# Patient Record
Sex: Male | Born: 1965
Health system: Southern US, Community
[De-identification: ages and names within clinical notes are randomized; demographics above are authoritative.]

## PROBLEM LIST (undated history)

## (undated) DIAGNOSIS — Z9289 Personal history of other medical treatment: Secondary | ICD-10-CM

## (undated) DIAGNOSIS — Z972 Presence of dental prosthetic device (complete) (partial): Secondary | ICD-10-CM

## (undated) DIAGNOSIS — E785 Hyperlipidemia, unspecified: Secondary | ICD-10-CM

## (undated) DIAGNOSIS — J42 Unspecified chronic bronchitis: Secondary | ICD-10-CM

## (undated) DIAGNOSIS — R945 Abnormal results of liver function studies: Secondary | ICD-10-CM

## (undated) DIAGNOSIS — R7301 Impaired fasting glucose: Secondary | ICD-10-CM

## (undated) DIAGNOSIS — Z87891 Personal history of nicotine dependence: Secondary | ICD-10-CM

## (undated) DIAGNOSIS — I10 Essential (primary) hypertension: Secondary | ICD-10-CM

## (undated) HISTORY — DX: Impaired fasting glucose: R73.01

## (undated) HISTORY — DX: Unspecified chronic bronchitis: J42

## (undated) HISTORY — DX: Hyperlipidemia, unspecified: E78.5

## (undated) HISTORY — DX: Personal history of nicotine dependence: Z87.891

## (undated) HISTORY — DX: Abnormal results of liver function studies: R94.5

## (undated) HISTORY — DX: Essential (primary) hypertension: I10

## (undated) HISTORY — DX: Presence of dental prosthetic device (complete) (partial): Z97.2

## (undated) HISTORY — DX: Personal history of other medical treatment: Z92.89

## (undated) HISTORY — PX: WISDOM TOOTH EXTRACTION: SHX21

---

## 2003-03-19 ENCOUNTER — Emergency Department (HOSPITAL_COMMUNITY): Admission: EM | Admit: 2003-03-19 | Discharge: 2003-03-19 | Payer: Self-pay | Admitting: *Deleted

## 2009-02-15 ENCOUNTER — Ambulatory Visit: Payer: Self-pay | Admitting: Family Medicine

## 2009-02-15 ENCOUNTER — Encounter: Payer: Self-pay | Admitting: Cardiology

## 2009-03-08 ENCOUNTER — Encounter: Payer: Self-pay | Admitting: Cardiology

## 2009-03-08 DIAGNOSIS — Z9289 Personal history of other medical treatment: Secondary | ICD-10-CM

## 2009-03-08 HISTORY — DX: Personal history of other medical treatment: Z92.89

## 2011-06-12 ENCOUNTER — Encounter: Payer: Self-pay | Admitting: Family Medicine

## 2011-12-04 ENCOUNTER — Encounter: Payer: Self-pay | Admitting: *Deleted

## 2014-05-05 ENCOUNTER — Encounter: Payer: Self-pay | Admitting: Family Medicine

## 2015-06-21 ENCOUNTER — Telehealth: Payer: Self-pay | Admitting: Family Medicine

## 2015-06-21 NOTE — Telephone Encounter (Signed)
Pt and Thaddeus Evitts called to schedule a CPE appt. Advised them that we are able to make an appt for the pt in our office because pt did not show or call to cancel the last CPE. PT says he does not remember that appt. He says he went to the cardiologist around that time in 2015 however I advised him that I saw that he went to cardiologist in 2013. He wants to know if we will remove this appt exception on his account since he does not remember the previous CPE.

## 2015-06-26 ENCOUNTER — Telehealth: Payer: Self-pay | Admitting: Family Medicine

## 2015-06-26 NOTE — Telephone Encounter (Signed)
Wife called and wants [place] to take her husband as a new pt.  He has no insurance.  I advised we are not taking patients with out insurance.  She will call me back when he gets insurance.  He has a no show from 2015, we will waive, since he used to be a patient of ours several years ago and wife is still a patient of ours.  I explained to her normally we will not see a 1st time pt if they no show.  She thinks he was in the hospital or something at that time.

## 2015-06-28 NOTE — Telephone Encounter (Signed)
He would be considered a new patient.  Right now he has no insurance.  He will contact us once he gets his insurance.

## 2016-04-02 HISTORY — PX: NO PAST SURGERIES: SHX2092

## 2016-04-10 ENCOUNTER — Encounter: Payer: Self-pay | Admitting: Medical

## 2016-04-10 ENCOUNTER — Encounter: Payer: Self-pay | Admitting: Gastroenterology

## 2016-04-10 ENCOUNTER — Ambulatory Visit (INDEPENDENT_AMBULATORY_CARE_PROVIDER_SITE_OTHER): Payer: Commercial Managed Care - PPO | Admitting: Medical

## 2016-04-10 VITALS — BP 140/80 | HR 78 | Resp 16 | Ht 73.0 in | Wt 197.4 lb

## 2016-04-10 DIAGNOSIS — Z720 Tobacco use: Secondary | ICD-10-CM

## 2016-04-10 DIAGNOSIS — Z23 Encounter for immunization: Secondary | ICD-10-CM | POA: Diagnosis not present

## 2016-04-10 DIAGNOSIS — Z7189 Other specified counseling: Secondary | ICD-10-CM

## 2016-04-10 DIAGNOSIS — Z125 Encounter for screening for malignant neoplasm of prostate: Secondary | ICD-10-CM | POA: Diagnosis not present

## 2016-04-10 DIAGNOSIS — Z113 Encounter for screening for infections with a predominantly sexual mode of transmission: Secondary | ICD-10-CM | POA: Diagnosis not present

## 2016-04-10 DIAGNOSIS — R9431 Abnormal electrocardiogram [ECG] [EKG]: Secondary | ICD-10-CM | POA: Diagnosis not present

## 2016-04-10 DIAGNOSIS — N529 Male erectile dysfunction, unspecified: Secondary | ICD-10-CM | POA: Diagnosis not present

## 2016-04-10 DIAGNOSIS — Z8249 Family history of ischemic heart disease and other diseases of the circulatory system: Secondary | ICD-10-CM

## 2016-04-10 DIAGNOSIS — Z7185 Encounter for immunization safety counseling: Secondary | ICD-10-CM

## 2016-04-10 DIAGNOSIS — R030 Elevated blood-pressure reading, without diagnosis of hypertension: Secondary | ICD-10-CM | POA: Diagnosis not present

## 2016-04-10 DIAGNOSIS — Z0000 Encounter for general adult medical examination without abnormal findings: Secondary | ICD-10-CM | POA: Diagnosis not present

## 2016-04-10 DIAGNOSIS — F17200 Nicotine dependence, unspecified, uncomplicated: Secondary | ICD-10-CM | POA: Insufficient documentation

## 2016-04-10 DIAGNOSIS — Z1211 Encounter for screening for malignant neoplasm of colon: Secondary | ICD-10-CM | POA: Diagnosis not present

## 2016-04-10 LAB — PSA: PSA: 0.4 ng/mL (ref <=4.0)

## 2016-04-10 LAB — POCT URINALYSIS DIPSTICK
Bilirubin, UA: NEGATIVE
GLUCOSE UA: NEGATIVE
KETONES UA: NEGATIVE
Leukocytes, UA: NEGATIVE
Nitrite, UA: NEGATIVE
Protein, UA: NEGATIVE
RBC UA: NEGATIVE
Urobilinogen, UA: NEGATIVE
pH, UA: 6

## 2016-04-10 LAB — CBC
HEMATOCRIT: 44.6 % (ref 38.5–50.0)
HEMOGLOBIN: 14.1 g/dL (ref 13.2–17.1)
MCH: 27.9 pg (ref 27.0–33.0)
MCHC: 31.6 g/dL — ABNORMAL LOW (ref 32.0–36.0)
MCV: 88.1 fL (ref 80.0–100.0)
MPV: 11.1 fL (ref 7.5–12.5)
Platelets: 264 10*3/uL (ref 140–400)
RBC: 5.06 MIL/uL (ref 4.20–5.80)
RDW: 14.2 % (ref 11.0–15.0)
WBC: 3.8 10*3/uL — AB (ref 4.0–10.5)

## 2016-04-10 MED ORDER — ASPIRIN EC 81 MG PO TBEC: 81.0000 mg | DELAYED_RELEASE_TABLET | Freq: Every day | ORAL | 3 refills | Status: DC

## 2016-04-10 MED ORDER — LOSARTAN POTASSIUM 25 MG PO TABS: 25.0000 mg | ORAL_TABLET | Freq: Every day | ORAL | 2 refills | Status: DC

## 2016-04-10 MED ORDER — VARENICLINE TARTRATE 0.5 MG X 11 & 1 MG X 42 PO MISC: ORAL | 0 refills | Status: DC

## 2016-04-10 NOTE — Progress Notes (Addendum)
Subjective:   HPI  Calvin Reid is a 50 y.o. male who presents for a complete physical.  New /returning patient.  Has been here several years ago.  No recent medical care.  No particular concerns.   At end of visit he does note problems getting and keeping erections, wants something to help.  Reviewed their medical, surgical, family, social, medication, and allergy history and updated chart as appropriate.  Past Medical History:  Diagnosis Date  . H/O cardiovascular stress test 03/08/2009   stress echo normal.   Dr. Peter Martinique  . Smoker   . Wears partial dentures     Past Surgical History:  Procedure Laterality Date  . NO PAST SURGERIES  04/2016    Social History   Social History  . Marital status: Single    Spouse name: N/A  . Number of children: N/A  . Years of education: N/A   Occupational History  . Not on file.   Social History Main Topics  . Smoking status: Current Every Day Smoker    Packs/day: 0.50    Years: 25.00    Types: Cigarettes  . Smokeless tobacco: Never Used  . Alcohol use 2.4 oz/week    4 Cans of beer per week  . Drug use: No  . Sexual activity: Not on file   Other Topics Concern  . 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   3 sons.   Divorced.   As of 04/2016.    Family History  Problem Relation Age of Onset  . Hypertension Mother   . COPD Father     died in his 103s  . Heart disease Sister 61    CAD  . Heart disease Son   . Heart disease Son   . Diabetes Brother   . Stroke Neg Hx   . Cancer Neg Hx      Current Outpatient Prescriptions:  .  Omega-3 Fatty Acids (FISH OIL PEARLS PO), Take by mouth., Disp: , Rfl:  .  aspirin EC 81 MG tablet, Take 1 tablet (81 mg total) by mouth daily., Disp: 90 tablet, Rfl: 3 .  losartan (COZAAR) 25 MG tablet, Take 1 tablet (25 mg total) by mouth daily., Disp: 30 tablet, Rfl: 2 .  varenicline (CHANTIX STARTING MONTH PAK) 0.5 MG X 11 & 1 MG X 42 tablet, Take  one 0.5 mg tablet by mouth once daily for 3 days, then increase to one 0.5 mg tablet twice daily for 4 days, then increase to one 1 mg tablet twice daily., Disp: 53 tablet, Rfl: 0  No Known Allergies    Review of Systems Constitutional: -fever, -chills, -sweats, -unexpected weight change, -decreased appetite, -fatigue Allergy: -sneezing, -itching, -congestion Dermatology: -changing moles, --rash, -lumps ENT: -runny nose, -ear pain, -sore throat, -hoarseness, -sinus pain, -teeth pain, - ringing in ears, -hearing loss, -nosebleeds Cardiology: -chest pain, -palpitations, -swelling, -difficulty breathing when lying flat, -waking up short of breath Respiratory: -cough, -shortness of breath, -difficulty breathing with exercise or exertion, -wheezing, -coughing up blood Gastroenterology: -abdominal pain, -nausea, -vomiting, -diarrhea, -constipation, -blood in stool, -changes in bowel movement, -difficulty swallowing or eating Hematology: -bleeding, -bruising  Musculoskeletal: -joint aches, -muscle aches, -joint swelling, -back pain, -neck pain, -cramping, -changes in gait Ophthalmology: denies vision changes, eye redness, itching, discharge Urology:+nocturia,  -burning with urination, -difficulty urinating, -blood in urine, -urinary frequency, -urgency, -incontinence Neurology: -headache, -weakness, -tingling, -numbness, -memory loss, -falls, -dizziness Psychology: -depressed  mood, -agitation, -sleep problems     Objective:   Physical Exam  BP 140/80   Pulse 78   Resp 16   Ht 6\' 1"  (1.854 m)   Wt 197 lb 6.4 oz (89.5 kg)   SpO2 98%   BMI 26.04 kg/m   General appearance: alert, no distress, WD/WN, AA male Skin: right preauricular area with brown raised 41mm round lesion unchanged for years per patient.   Few scattered macules.  No worrisome lesions HEENT: normocephalic, conjunctiva/corneas normal, sclerae anicteric, PERRLA, EOMi, nares patent, no discharge or erythema, pharynx normal Oral  cavity: MMM, tongue normal, teeth - upper denture Neck: supple, no lymphadenopathy, no thyromegaly, no masses, normal ROM, no bruits Chest: non tender, normal shape and expansion Heart: RRR, normal S1, S2, no murmurs Lungs: CTA bilaterally, no wheezes, rhonchi, or rales Abdomen: +bs, soft, non tender, non distended, no masses, no hepatomegaly, no splenomegaly, no bruits Back: non tender, normal ROM, no scoliosis Musculoskeletal: upper extremities non tender, no obvious deformity, normal ROM throughout, lower extremities non tender, no obvious deformity, normal ROM throughout Extremities: no edema, no cyanosis, no clubbing Pulses: 2+ symmetric, upper and lower extremities, normal cap refill Neurological: alert, oriented x 3, CN2-12 intact, strength normal upper extremities and lower extremities, sensation normal throughout, DTRs 2+ throughout, no cerebellar signs, gait normal Psychiatric: normal affect, behavior normal, pleasant  GU: normal male external genitalia, circumcised, nontender, no masses, no hernia, no lymphadenopathy Rectal: anus normal tone, prostate WNL, no nodules   Adult ECG Report  Indication: physical, smoker, family history  Rate: 59 bpm  Rhythm: sinus bradycardia  QRS Axis: 83 degrees  PR Interval: 239ms  QRS Duration: 52ms  QTc: 444ms  Conduction Disturbances: first-degree A-V block   Other Abnormalities: T wave inversions thorughout!  Patient's cardiac risk factors are: family history of premature cardiovascular disease, male gender and smoking/ tobacco exposure.  EKG comparison: 2010  Narrative Interpretation: new T wave inversions, 1st degree block, LVH!    Assessment and Plan :    Encounter Diagnoses  Name Primary?  . Annual physical exam Yes  . Smoker   . Special screening for malignant neoplasms, colon   . Family history of premature CAD   . Vaccine counseling   . Need for prophylactic vaccination against Streptococcus pneumoniae (pneumococcus)   .  Screening for prostate cancer   . Screen for STD (sexually transmitted disease)   . Nonspecific abnormal electrocardiogram (ECG) (EKG)   . Elevated blood-pressure reading without diagnosis of hypertension   . Erectile dysfunction, unspecified erectile dysfunction type     Physical exam - discussed healthy lifestyle, diet, exercise, preventative care, vaccinations, and addressed their concerns.   See your eye doctor yearly for routine vision care. See your dentist yearly for routine dental care including hygiene visits twice yearly. Refer for 1st colonoscopy PSA screen today, discussed risks/benefits Advised yearly flu shot Counseled on the pneumococcal vaccine.  Vaccine information sheet given.  Pneumococcal vaccine PPSV 23 given after consent obtained. Smoker - he is agreeable to try quitting. Advised counseling, begin trial of Chantix.   discussed risks/benefits of medication.  F/u 24mo. Elevated BP - advised smoking cessation, exercise, salt avoidance, healthy diet. referral to cardiology for abnormal EKG with concerning findings.  Given abnormal EKG, elevated BP, smoker, and risk factors, begin Losartan and Aspirin 81mg  QHS.  ED- will defer until after cardiology f/u Follow-up pending labs  Kanyon was seen today for annual exam.  Diagnoses and all orders for this visit:  Annual physical exam -     Urinalysis Dipstick -     Comprehensive metabolic panel -     CBC -     Lipid panel -     PSA -     Hemoglobin A1c -     HIV antibody -     RPR -     GC/Chlamydia Probe Amp -     EKG 12-Lead -     Ambulatory referral to Gastroenterology  Smoker -     EKG 12-Lead -     Ambulatory referral to Cardiology  Special screening for malignant neoplasms, colon -     Ambulatory referral to Gastroenterology  Family history of premature CAD -     EKG 12-Lead -     Ambulatory referral to Cardiology  Vaccine counseling  Need for prophylactic vaccination against Streptococcus  pneumoniae (pneumococcus)  Screening for prostate cancer -     PSA  Screen for STD (sexually transmitted disease) -     HIV antibody -     RPR -     GC/Chlamydia Probe Amp  Nonspecific abnormal electrocardiogram (ECG) (EKG) -     Ambulatory referral to Cardiology  Elevated blood-pressure reading without diagnosis of hypertension -     Ambulatory referral to Cardiology  Erectile dysfunction, unspecified erectile dysfunction type  Other orders -     aspirin EC 81 MG tablet; Take 1 tablet (81 mg total) by mouth daily. -     losartan (COZAAR) 25 MG tablet; Take 1 tablet (25 mg total) by mouth daily. -     varenicline (CHANTIX STARTING MONTH PAK) 0.5 MG X 11 & 1 MG X 42 tablet; Take one 0.5 mg tablet by mouth once daily for 3 days, then increase to one 0.5 mg tablet twice daily for 4 days, then increase to one 1 mg tablet twice daily.

## 2016-04-10 NOTE — Patient Instructions (Signed)
Encounter Diagnoses  Name Primary?  . Annual physical exam Yes  . Smoker   . Special screening for malignant neoplasms, colon   . Family history of premature CAD   . Vaccine counseling   . Need for prophylactic vaccination against Streptococcus pneumoniae (pneumococcus)   . Screening for prostate cancer   . Screen for STD (sexually transmitted disease)   . Nonspecific abnormal electrocardiogram (ECG) (EKG)   . Elevated blood-pressure reading without diagnosis of hypertension    Recommendations:  Your EKG is abnormal.  I want to refer you back to cardiology for consult  Begin Aspirin 81mg  nightly for heart health  Begin Losartan 25mg  daily for possible enlarged heart and elevated blood pressure given the EKG findings  After a week or so tolerating these medications, then begin Chantix to help stop tobacco  Pick a quit date to stop tobacco  See your eye doctor yearly for routine vision care.  See your dentist yearly for routine dental care including hygiene visits twice yearly.  Get a flu shot yearly.  We should have them in within the month  We are referring you for your first colonoscopy  We updated your pneumonia vaccine today

## 2016-04-10 NOTE — Addendum Note (Signed)
Addended by: Arley Phenix L on: 04/10/2016 10:14 AM   Modules accepted: Orders

## 2016-04-11 LAB — LIPID PANEL
CHOL/HDL RATIO: 3.8 ratio (ref <=5.0)
Cholesterol: 198 mg/dL (ref 125–200)
HDL: 52 mg/dL (ref >=40)
LDL CALC: 132 mg/dL — AB (ref <130)
TRIGLYCERIDES: 70 mg/dL (ref <150)
VLDL: 14 mg/dL (ref <30)

## 2016-04-11 LAB — COMPREHENSIVE METABOLIC PANEL
ALBUMIN: 5 g/dL (ref 3.6–5.1)
ALT: 47 U/L — ABNORMAL HIGH (ref 9–46)
AST: 37 U/L — ABNORMAL HIGH (ref 10–35)
Alkaline Phosphatase: 90 U/L (ref 40–115)
BUN: 19 mg/dL (ref 7–25)
CALCIUM: 10.6 mg/dL — AB (ref 8.6–10.3)
CHLORIDE: 104 mmol/L (ref 98–110)
CO2: 25 mmol/L (ref 20–31)
Creat: 1.17 mg/dL (ref 0.70–1.33)
Glucose, Bld: 93 mg/dL (ref 65–99)
POTASSIUM: 4.7 mmol/L (ref 3.5–5.3)
Sodium: 141 mmol/L (ref 135–146)
TOTAL PROTEIN: 7.8 g/dL (ref 6.1–8.1)
Total Bilirubin: 0.8 mg/dL (ref 0.2–1.2)

## 2016-04-11 LAB — HIV ANTIBODY (ROUTINE TESTING W REFLEX): HIV: NONREACTIVE

## 2016-04-11 LAB — GC/CHLAMYDIA PROBE AMP
CT Probe RNA: NOT DETECTED
GC Probe RNA: NOT DETECTED

## 2016-04-11 LAB — HEMOGLOBIN A1C
Hgb A1c MFr Bld: 5.8 % — ABNORMAL HIGH (ref <5.7)
Mean Plasma Glucose: 120 mg/dL

## 2016-04-11 LAB — RPR

## 2016-04-23 ENCOUNTER — Encounter: Payer: Self-pay | Admitting: Cardiology

## 2016-04-23 NOTE — Progress Notes (Signed)
Cardiology Office Note    Date:  04/24/2016   ID:  Calvin Reid, DOB 1966/02/09, MRN [id number]  PCP:  Crisoforo Oxford, PA-C  Cardiologist:  Daniela Siebers [place], MD    History of Present Illness:  Calvin Reid is a 50 y.o. male is seen at the request of Dorothea Ogle PA-C for evaluation of abnormal Ecg. He has a history of tobacco use. He is now on losartan for elevated BP. He was seen in 2010 for abnormal Ecg as well and had a normal Stress Echo in July 2010. Notes stated he had T wave inversions at the time that normalized with exercise. When recently seen for a physical Ecg was abnormal showing LVH with fairly striking T wave inversions. According to records these findings were more pronounced than prior Ecgs but I do not have a copy of prior tracings. He denies any chest pain or SOB. He works as a Patent attorney. No dizziness or syncope. Feels well overall. No family history of premature death or significant cardiac disease.    Past Medical History:  Diagnosis Date  . H/O cardiovascular stress test 03/08/2009   stress echo normal.   Dr. Javarian Jakubiak Martinique  . Smoker   . Wears partial dentures     Past Surgical History:  Procedure Laterality Date  . NO PAST SURGERIES  04/2016    Current Medications: Outpatient Medications Prior to Visit  Medication Sig Dispense Refill  . aspirin EC 81 MG tablet Take 1 tablet (81 mg total) by mouth daily. 90 tablet 3  . losartan (COZAAR) 25 MG tablet Take 1 tablet (25 mg total) by mouth daily. 30 tablet 2  . Omega-3 Fatty Acids (FISH OIL PEARLS PO) Take 1 capsule by mouth daily.     . varenicline (CHANTIX STARTING MONTH PAK) 0.5 MG X 11 & 1 MG X 42 tablet Take one 0.5 mg tablet by mouth once daily for 3 days, then increase to one 0.5 mg tablet twice daily for 4 days, then increase to one 1 mg tablet twice daily. 53 tablet 0   No facility-administered medications prior to visit.      Allergies:   Review of patient's allergies indicates no  known allergies.   Social History   Social History  . Marital status: Single    Spouse name: N/A  . Number of children: 2  . Years of education: N/A   Occupational History  . fork Theme park manager    Social History Main Topics  . Smoking status: Former Smoker    Packs/day: 0.50    Years: 25.00    Types: Cigarettes    Quit date: 04/21/2016  . Smokeless tobacco: Never Used  . Alcohol use 2.4 oz/week    4 Cans of beer per week  . Drug use: No  . Sexual activity: Not Asked   Other Topics Concern  . 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   3 sons.   Divorced.   As of 04/2016.     Family History:  The patient's family history includes COPD in his father; Diabetes in his brother; Heart disease in his son and son; Heart disease (age of onset: 69) in his sister; Hypertension in his mother. sister's heart disease is unknown. Son has arrhythmia related to Adderall.  ROS:   Please see the history of present illness.    ROS All other systems reviewed and are negative.   PHYSICAL EXAM:  VS:  BP 112/90 (BP Location: Left Arm) Comment: 116/90 Right Arm  Pulse (!) 53   Ht 6\' 1"  (1.854 m)   Wt 201 lb 6.4 oz (91.4 kg)   BMI 26.57 kg/m    GEN: Well nourished, well developed, in no acute distress  HEENT: normal  Neck: no JVD, carotid bruits, or masses Cardiac: RRR; no murmurs, rubs, or gallops,no edema  Respiratory:  clear to auscultation bilaterally, normal work of breathing GI: soft, nontender, nondistended, + BS MS: no deformity or atrophy  Skin: warm and dry, no rash Neuro:  Alert and Oriented x 3, Strength and sensation are intact Psych: euthymic mood, full affect  Wt Readings from Last 3 Encounters:  04/24/16 201 lb 6.4 oz (91.4 kg)  04/10/16 197 lb 6.4 oz (89.5 kg)  02/15/09 196 lb (88.9 kg)      Studies/Labs Reviewed:   EKG:  EKG is ordered today.  The ekg ordered today demonstrates NSR, LVH with marked repolarization abnormality. I  have personally reviewed and interpreted this study.   Recent Labs: 04/10/2016: ALT 47; BUN 19; Creat 1.17; Hemoglobin 14.1; Platelets 264; Potassium 4.7; Sodium 141   Lipid Panel    Component Value Date/Time   CHOL 198 04/10/2016 0849   TRIG 70 04/10/2016 0849   HDL 52 04/10/2016 0849   CHOLHDL 3.8 04/10/2016 0849   VLDL 14 04/10/2016 0849   LDLCALC 132 (H) 04/10/2016 0849    Additional studies/ records that were reviewed today include:  none    ASSESSMENT:    1. Abnormal EKG   2. Smoker   3. Elevated blood-pressure reading without diagnosis of hypertension   4. Abnormal ECG      PLAN:  In order of problems listed above:  1. Ecg is most consistent with LVH and repolarization abnormality. Will request old records to review prior tracings. Will update Echo. If Echo is OK no further evaluation is needed. This may be a normal variant repolarization abnormality in a young black male. Given lack of symptoms I do not think ischemic evaluation is needed unless EF is low. 2.   Tobacco abuse. He is trying to quit with Chantix. Encouraged efforts. 3.   Elevated BP. Improved on losartan.   He asked if he is ok to take Viagra and I see no contraindication.     Medication Adjustments/Labs and Tests Ordered: Current medicines are reviewed at length with the patient today.  Concerns regarding medicines are outlined above.  Medication changes, Labs and Tests ordered today are listed in the Patient Instructions below. Patient Instructions  We will schedule you for an echocardiogram       Signed, Tomeca Helm Martinique, MD  04/24/2016 9:14 AM    [place] [address] [place], [place], [address] [phone]

## 2016-04-24 ENCOUNTER — Encounter: Payer: Self-pay | Admitting: Cardiology

## 2016-04-24 ENCOUNTER — Ambulatory Visit (INDEPENDENT_AMBULATORY_CARE_PROVIDER_SITE_OTHER): Payer: Commercial Managed Care - PPO | Admitting: Cardiology

## 2016-04-24 VITALS — BP 112/90 | HR 53 | Ht 73.0 in | Wt 201.4 lb

## 2016-04-24 DIAGNOSIS — R030 Elevated blood-pressure reading, without diagnosis of hypertension: Secondary | ICD-10-CM | POA: Diagnosis not present

## 2016-04-24 DIAGNOSIS — R9431 Abnormal electrocardiogram [ECG] [EKG]: Secondary | ICD-10-CM

## 2016-04-24 DIAGNOSIS — Z720 Tobacco use: Secondary | ICD-10-CM | POA: Diagnosis not present

## 2016-04-24 DIAGNOSIS — F17200 Nicotine dependence, unspecified, uncomplicated: Secondary | ICD-10-CM

## 2016-04-24 NOTE — Patient Instructions (Signed)
We will schedule you for an echocardiogram.

## 2016-05-04 ENCOUNTER — Other Ambulatory Visit: Payer: Self-pay | Admitting: Medical

## 2016-05-08 ENCOUNTER — Other Ambulatory Visit: Payer: Self-pay

## 2016-05-08 ENCOUNTER — Ambulatory Visit (HOSPITAL_COMMUNITY): Payer: Commercial Managed Care - PPO | Attending: Internal Medicine

## 2016-05-08 DIAGNOSIS — R9431 Abnormal electrocardiogram [ECG] [EKG]: Secondary | ICD-10-CM | POA: Diagnosis present

## 2016-05-08 DIAGNOSIS — I5189 Other ill-defined heart diseases: Secondary | ICD-10-CM | POA: Insufficient documentation

## 2016-05-08 DIAGNOSIS — R030 Elevated blood-pressure reading, without diagnosis of hypertension: Secondary | ICD-10-CM | POA: Diagnosis not present

## 2016-05-08 DIAGNOSIS — I517 Cardiomegaly: Secondary | ICD-10-CM | POA: Insufficient documentation

## 2016-05-08 DIAGNOSIS — F17200 Nicotine dependence, unspecified, uncomplicated: Secondary | ICD-10-CM

## 2016-05-08 DIAGNOSIS — Z720 Tobacco use: Secondary | ICD-10-CM | POA: Insufficient documentation

## 2016-05-08 DIAGNOSIS — I340 Nonrheumatic mitral (valve) insufficiency: Secondary | ICD-10-CM | POA: Diagnosis not present

## 2016-05-08 DIAGNOSIS — Z8249 Family history of ischemic heart disease and other diseases of the circulatory system: Secondary | ICD-10-CM | POA: Insufficient documentation

## 2016-05-08 LAB — ECHOCARDIOGRAM COMPLETE
Ao-asc: 35 cm
E decel time: 280 msec
E/e' ratio: 6.61
FS: 50 % — AB (ref 28–44)
IVS/LV PW RATIO, ED: 1.51
LA ID, A-P, ES: 27 mm
LA diam index: 1.25 cm/m2
LA vol A4C: 37 ml
LA vol index: 14.8 mL/m2
LAVOL: 32 mL
LDCA: 3.14 cm2
LEFT ATRIUM END SYS DIAM: 27 mm
LV E/e' medial: 6.61
LV PW d: 8.15 mm — AB (ref 0.6–1.1)
LV TDI E'LATERAL: 11.8
LV TDI E'MEDIAL: 8.11
LV e' LATERAL: 11.8 cm/s
LVEEAVG: 6.61
LVOT VTI: 29.2 cm
LVOT peak grad rest: 6 mmHg
LVOT peak vel: 125 cm/s
LVOTD: 20 mm
LVOTSV: 92 mL
MV Dec: 280
MV pk A vel: 73.5 m/s
MV pk E vel: 78 m/s
MVPG: 2 mmHg
RV LATERAL S' VELOCITY: 12 cm/s
S' Lateral: 12 cm/s

## 2016-05-27 ENCOUNTER — Ambulatory Visit (AMBULATORY_SURGERY_CENTER): Payer: Self-pay

## 2016-05-27 VITALS — Ht 73.0 in | Wt 204.6 lb

## 2016-05-27 DIAGNOSIS — Z1211 Encounter for screening for malignant neoplasm of colon: Secondary | ICD-10-CM

## 2016-05-27 MED ORDER — SUPREP BOWEL PREP KIT 17.5-3.13-1.6 GM/177ML PO SOLN: 1.0000 | Freq: Once | ORAL | 0 refills | Status: AC

## 2016-05-27 NOTE — Progress Notes (Signed)
No allergies to eggs or soy No past problems with anesthesia No diet meds No home oxygen  Has email and internet registered for emmi 

## 2016-05-30 ENCOUNTER — Encounter: Payer: Self-pay | Admitting: Gastroenterology

## 2016-06-11 ENCOUNTER — Ambulatory Visit (AMBULATORY_SURGERY_CENTER): Payer: Commercial Managed Care - PPO | Admitting: Gastroenterology

## 2016-06-11 ENCOUNTER — Telehealth: Payer: Self-pay | Admitting: Medical

## 2016-06-11 ENCOUNTER — Encounter: Payer: Self-pay | Admitting: Gastroenterology

## 2016-06-11 VITALS — BP 121/80 | HR 50 | Temp 97.1°F | Resp 19 | Ht 73.0 in | Wt 204.0 lb

## 2016-06-11 DIAGNOSIS — Z1212 Encounter for screening for malignant neoplasm of rectum: Secondary | ICD-10-CM

## 2016-06-11 DIAGNOSIS — K621 Rectal polyp: Secondary | ICD-10-CM

## 2016-06-11 DIAGNOSIS — Z1211 Encounter for screening for malignant neoplasm of colon: Secondary | ICD-10-CM | POA: Diagnosis not present

## 2016-06-11 DIAGNOSIS — D128 Benign neoplasm of rectum: Secondary | ICD-10-CM

## 2016-06-11 DIAGNOSIS — K631 Perforation of intestine (nontraumatic): Secondary | ICD-10-CM | POA: Diagnosis not present

## 2016-06-11 HISTORY — PX: COLONOSCOPY: SHX174

## 2016-06-11 MED ORDER — SODIUM CHLORIDE 0.9 % IV SOLN: 500.0000 mL | INTRAVENOUS | Status: DC

## 2016-06-11 NOTE — Patient Instructions (Signed)
YOU HAD AN ENDOSCOPIC PROCEDURE TODAY AT [facility]:   Refer to the procedure report that was given to you for any specific questions about what was found during the examination.  If the procedure report does not answer your questions, please call your gastroenterologist to clarify.  If you requested that your care partner not be given the details of your procedure findings, then the procedure report has been included in a sealed envelope for you to review at your convenience later.  YOU SHOULD EXPECT: Some feelings of bloating in the abdomen. Passage of more gas than usual.  Walking can help get rid of the air that was put into your GI tract during the procedure and reduce the bloating. If you had a lower endoscopy (such as a colonoscopy or flexible sigmoidoscopy) you may notice spotting of blood in your stool or on the toilet paper. If you underwent a bowel prep for your procedure, you may not have a normal bowel movement for a few days.  Please Note:  You might notice some irritation and congestion in your nose or some drainage.  This is from the oxygen used during your procedure.  There is no need for concern and it should clear up in a day or so.  SYMPTOMS TO REPORT IMMEDIATELY:   Following lower endoscopy (colonoscopy or flexible sigmoidoscopy):  Excessive amounts of blood in the stool  Significant tenderness or worsening of abdominal pains  Swelling of the abdomen that is new, acute  Fever of 100F or higher    For urgent or emergent issues, a gastroenterologist can be reached at any hour by calling [phone].   DIET:  We do recommend a small meal at first, but then you may proceed to your regular diet.  Drink plenty of fluids but you should avoid alcoholic beverages for 24 hours.  ACTIVITY:  You should plan to take it easy for the rest of today and you should NOT DRIVE or use heavy machinery until tomorrow (because of the sedation medicines used during the test).     FOLLOW UP: Our staff will call the number listed on your records the next business day following your procedure to check on you and address any questions or concerns that you may have regarding the information given to you following your procedure. If we do not reach you, we will leave a message.  However, if you are feeling well and you are not experiencing any problems, there is no need to return our call.  We will assume that you have returned to your regular daily activities without incident.  If any biopsies were taken you will be contacted by phone or by letter within the next 1-3 weeks.  Please call us at [phone] if you have not heard about the biopsies in 3 weeks.    SIGNATURES/CONFIDENTIALITY: You and/or your care partner have signed paperwork which will be entered into your electronic medical record.  These signatures attest to the fact that that the information above on your After Visit Summary has been reviewed and is understood.  Full responsibility of the confidentiality of this discharge information lies with you and/or your care-partner.    Resume medications. information given on polyps and hemorrhoids.

## 2016-06-11 NOTE — Telephone Encounter (Signed)
Pt's wife called and stated that a while back pt requested a rx for Viagra. She states that pt was informed to check with Cardio to make sure ok. Pt's wife called today and stated that pt saw his cardio, Dr. Martinique, and he ok'd that medication. I did look in note from a Dr. Martinique appt  dated 04/24/2016 and he does state Viagra ok. Pt's wife on behalf of pt is requesting rx be sent into [facility]. Phone to reach pt is [phone].

## 2016-06-11 NOTE — Op Note (Signed)
[facility] Patient Name: Calvin Reid Procedure Date: 06/11/2016 10:54 AM MRN: [id number] Endoscopist: Ladene Artist , MD Age: 50 Referring MD:  Date of Birth: 1965/09/08 Gender: Male Account #: [id number] Procedure:                Colonoscopy Indications:              Screening for colorectal malignant neoplasm Medicines:                Monitored Anesthesia Care Procedure:                Pre-Anesthesia Assessment:                           - Prior to the procedure, a History and Physical                            was performed, and patient medications and                            allergies were reviewed. The patient's tolerance of                            previous anesthesia was also reviewed. The risks                            and benefits of the procedure and the sedation                            options and risks were discussed with the patient.                            All questions were answered, and informed consent                            was obtained. Prior Anticoagulants: The patient has                            taken no previous anticoagulant or antiplatelet                            agents. ASA Grade Assessment: II - A patient with                            mild systemic disease. After reviewing the risks                            and benefits, the patient was deemed in                            satisfactory condition to undergo the procedure.                           After obtaining informed consent, the colonoscope  was passed under direct vision. Throughout the                            procedure, the patient's blood pressure, pulse, and                            oxygen saturations were monitored continuously. The                            Model PCF-H190L [id number]) scope was introduced                            through the anus and advanced to the the cecum,                            identified by  appendiceal orifice and ileocecal                            valve. The ileocecal valve, appendiceal orifice,                            and rectum were photographed. The quality of the                            bowel preparation was good. The colonoscopy was                            performed without difficulty. The patient tolerated                            the procedure well. Scope In: 11:01:33 AM Scope Out: 11:11:56 AM Scope Withdrawal Time: 0 hours 8 minutes 31 seconds  Total Procedure Duration: 0 hours 10 minutes 23 seconds  Findings:                 The perianal and digital rectal examinations were                            normal.                           A 6 mm polyp was found in the rectum. The polyp was                            sessile. The polyp was removed with a cold snare.                            Resection and retrieval were complete.                           Internal hemorrhoids were found during                            retroflexion. The hemorrhoids were small and Grade  I (internal hemorrhoids that do not prolapse).                           The exam was otherwise without abnormality on                            direct and retroflexion views. Complications:            No immediate complications. Estimated blood loss:                            None. Estimated Blood Loss:     Estimated blood loss: none. Impression:               - One 6 mm polyp in the rectum, removed with a cold                            snare. Resected and retrieved.                           - Internal hemorrhoids.                           - The examination was otherwise normal on direct                            and retroflexion views. Recommendation:           - Repeat colonoscopy in 5 years for surveillance if                            polyp is precancerous, otherwise 10 years for                            screening.                           -  Patient has a contact number available for                            emergencies. The signs and symptoms of potential                            delayed complications were discussed with the                            patient. Return to normal activities tomorrow.                            Written discharge instructions were provided to the                            patient.                           - Resume previous diet.                           -  Continue present medications.                           - Await pathology results. Ladene Artist, MD 06/11/2016 11:14:57 AM This report has been signed electronically.

## 2016-06-11 NOTE — Telephone Encounter (Signed)
I reviewed the cardiology notes.  He is due back for f/u on abnormla labs form his phsyical. Please schecule this  Regarding Viagra, I don't think he has ever taken this.   Please call in Viagra.  Make sure he only uses 1 tablet maximum in a 24 hours period.  Insurance usually only allows a few dispensed per month. If too expensive, we can discuss other options.   Let him know the following since we didn't discuss prior:  VIAGRA can cause serious side effects: Do not take VIAGRA (sildenafil citrate) if you now or in the future use any medicines called nitrates, often prescribed for chest pain, or Adempas (riociguat) for pulmonary hypertension. Your blood pressure could drop to an unsafe level  A rarely reported side effect could include an erection that will not go away (priapism). If you have an erection that lasts more than 1 hours, get medical help right away. If it is not treated right away, priapism can permanently damage your penis  If you have sudden vision loss in one or both eyes, this can be a sign of a serious eye problem (non-arteritic anterior ischemic optic neuropathy.  Stop taking VIAGRA and call your healthcare provider right away if you have any sudden vision loss  Call your healthcare provider right away if you have sudden hearing decreased, hearing loss or sudden ringing in the ears.  If you experience chest pain, dizziness, or nausea during sex, seek immediate medical help

## 2016-06-11 NOTE — Progress Notes (Signed)
Called to room to assist during endoscopic procedure.  Patient ID and intended procedure confirmed with present staff. Received instructions for my participation in the procedure from the performing physician.  

## 2016-06-11 NOTE — Progress Notes (Signed)
Report to PACU, RN, vss, BBS= Clear.  

## 2016-06-12 ENCOUNTER — Encounter: Payer: Self-pay | Admitting: *Deleted

## 2016-06-12 ENCOUNTER — Telehealth: Payer: Self-pay

## 2016-06-12 ENCOUNTER — Other Ambulatory Visit: Payer: Self-pay | Admitting: Medical

## 2016-06-12 MED ORDER — SILDENAFIL CITRATE 100 MG PO TABS: ORAL_TABLET | ORAL | 0 refills | Status: DC

## 2016-06-12 NOTE — Telephone Encounter (Signed)
Notified pt's wife, Mel Almond, advised her to look for the information in the mail and also sent her a MyChart sign up text message so I can send information through mychart as well.

## 2016-06-12 NOTE — Telephone Encounter (Signed)
See msg below, sorry for the long message.  If needed, mail it to him, schedule his f/u and tell him not to take the Viagra until he reads the precautions

## 2016-06-12 NOTE — Telephone Encounter (Signed)
  Follow up Call-  Call back number 06/11/2016  Post procedure Call Back phone  # [phone]  Permission to leave phone message Yes  Some recent data might be hidden     Patient questions:  Do you have a fever, pain , or abdominal swelling? No. Pain Score  0 *  Have you tolerated food without any problems? Yes.    Have you been able to return to your normal activities? Yes.    Do you have any questions about your discharge instructions: Diet   No. Medications  No. Follow up visit  No.  Do you have questions or concerns about your Care? No.  Actions: * If pain score is 4 or above: No action needed, pain <4.

## 2016-06-24 ENCOUNTER — Telehealth: Payer: Self-pay | Admitting: Medical

## 2016-06-24 NOTE — Telephone Encounter (Signed)
P.A. VIAGRA  °

## 2016-06-25 ENCOUNTER — Encounter: Payer: Self-pay | Admitting: Gastroenterology

## 2016-07-03 ENCOUNTER — Encounter: Payer: Self-pay | Admitting: Medical

## 2016-07-03 ENCOUNTER — Ambulatory Visit (INDEPENDENT_AMBULATORY_CARE_PROVIDER_SITE_OTHER): Payer: Commercial Managed Care - PPO | Admitting: Medical

## 2016-07-03 VITALS — BP 160/108 | HR 72 | Resp 16 | Ht 74.0 in | Wt 203.6 lb

## 2016-07-03 DIAGNOSIS — Z87891 Personal history of nicotine dependence: Secondary | ICD-10-CM

## 2016-07-03 DIAGNOSIS — R7301 Impaired fasting glucose: Secondary | ICD-10-CM

## 2016-07-03 DIAGNOSIS — I517 Cardiomegaly: Secondary | ICD-10-CM | POA: Diagnosis not present

## 2016-07-03 DIAGNOSIS — N529 Male erectile dysfunction, unspecified: Secondary | ICD-10-CM

## 2016-07-03 DIAGNOSIS — I10 Essential (primary) hypertension: Secondary | ICD-10-CM | POA: Diagnosis not present

## 2016-07-03 DIAGNOSIS — R945 Abnormal results of liver function studies: Secondary | ICD-10-CM

## 2016-07-03 DIAGNOSIS — Z86010 Personal history of colonic polyps: Secondary | ICD-10-CM

## 2016-07-03 DIAGNOSIS — R7989 Other specified abnormal findings of blood chemistry: Secondary | ICD-10-CM | POA: Diagnosis not present

## 2016-07-03 HISTORY — DX: Other specified abnormal findings of blood chemistry: R79.89

## 2016-07-03 MED ORDER — LOSARTAN POTASSIUM 50 MG PO TABS: 50.0000 mg | ORAL_TABLET | Freq: Every day | ORAL | 3 refills | Status: DC

## 2016-07-03 NOTE — Progress Notes (Signed)
Subjective: Chief Complaint  Patient presents with  . Follow-up    saw cardiologist and is following up after this. Unsure if he would like flu shot, will think about it.    Here for f/u from his physical visit in August.  We are discussing several things from his last visit.  Since last visit he has been compliant with Losartan 25mg  daily.  Not checking BP much, but was normal at [facility] the other day.   Since last visit began trial of Viagra 100mg  and this worked well.  No side effects.    Elevated LFTs - Drinks beer, probably 2 cans daily.  No prior hepatitis infection. No hx/o IV drug use.    Tobacco use - Quit 2 months ago   He has no other c/o.    Past Medical History:  Diagnosis Date  . Elevated LFTs 07/2016  . Former smoker    quit 04/2016  . H/O cardiovascular stress test 03/08/2009   stress echo normal.   Dr. Peter Martinique  . Hypertension   . Impaired fasting blood sugar   . Wears partial dentures    Current Outpatient Prescriptions on File Prior to Visit  Medication Sig Dispense Refill  . aspirin EC 81 MG tablet Take 1 tablet (81 mg total) by mouth daily. 90 tablet 3  . sildenafil (VIAGRA) 100 MG tablet 1/2-1 tablet po daily prn 10 tablet 0  . Omega-3 Fatty Acids (FISH OIL PEARLS PO) Take 1 capsule by mouth daily.      Current Facility-Administered Medications on File Prior to Visit  Medication Dose Route Frequency Provider Last Rate Last Dose  . 0.9 %  sodium chloride infusion  500 mL Intravenous Continuous Ladene Artist, MD       ROS as in subjective    Objective: BP (!) 160/108 (BP Location: Left Arm, Patient Position: Sitting, Cuff Size: Normal)   Pulse 72   Resp 16   Ht 6\' 2"  (1.88 m)   Wt 203 lb 9.6 oz (92.4 kg)   BMI 26.14 kg/m   BP Readings from Last 3 Encounters:  07/03/16 (!) 160/108  06/11/16 121/80  04/24/16 112/90   Wt Readings from Last 3 Encounters:  07/03/16 203 lb 9.6 oz (92.4 kg)  06/11/16 204 lb (92.5 kg)  05/27/16 204 lb 9.6  oz (92.8 kg)   Gen: wd, wn, nad Heart :RRR normal s1, s2, no murmurs Lungs clear Ext: no edema Abdomen:+bs, soft , nontender, no mass, no organomegaly Pulses 2+   Assessment: Encounter Diagnoses  Name Primary?  . Essential hypertension Yes  . Erectile dysfunction, unspecified erectile dysfunction type   . Impaired fasting blood sugar   . History of colonic polyps   . Elevated LFTs   . Hypercalcemia   . LVH (left ventricular hypertrophy)   . Former smoker      Plan:  Discussed labs from last visit showing elevated LFTs, impaired glucose, elevated calcium, slightly elevated calcium.   discussed diet, exercise, glad to hear he quit tobacco 2 moths ago Advised he cut back on alcohol advised he c/t healthy lifestyle changes he has made He is doing fine on Viagra Increase losartan to 50mg  daily, check BPs at home Reviewed his recent cardiology notes, echo, and colonoscopy report F/u 44mo for nurse visit for lab and flu shot at that time.   Fatih was seen today for follow-up.  Diagnoses and all orders for this visit:  Essential hypertension  Erectile dysfunction, unspecified erectile dysfunction  type  Impaired fasting blood sugar  History of colonic polyps  Elevated LFTs -     Comprehensive metabolic panel; Future  Hypercalcemia -     Comprehensive metabolic panel; Future  LVH (left ventricular hypertrophy)  Former smoker  Other orders -     losartan (COZAAR) 50 MG tablet; Take 1 tablet (50 mg total) by mouth daily.

## 2016-07-03 NOTE — Patient Instructions (Addendum)
Encounter Diagnoses  Name Primary?  . Essential hypertension Yes  . Erectile dysfunction, unspecified erectile dysfunction type   . Impaired fasting blood sugar   . History of colonic polyps   . Elevated LFTs   . Hypercalcemia   . LVH (left ventricular hypertrophy)   . Former smoker     Recommendation:  I am very happy you quit tobacco!!!!!!!!!!!!!  Continue to stay away from the cancer sticks  I increased your Losartan to 50mg  tablets today  Please check your blood pressure a few times per week over the next month to make sure we are at goal  Goal is 120-130 top number, 70-80 bottom number  If you are running over 140/90 or running less than 100/60, then let us know  Call or My Chart message [place] in 1 month to make sure blood pressures are running where we need them  continue Viagra as needed, no more than once daily  Consider [employer] for cheaper Viagra pricing  Return in 1 month to have nurse lab visit to recheck liver and calcium  Continue healthy diet, exercse, and try cutting back on meat/dairy  DASH Eating Plan DASH stands for "Dietary Approaches to Stop Hypertension." The DASH eating plan is a healthy eating plan that has been shown to reduce high blood pressure (hypertension). Additional health benefits may include reducing the risk of type 2 diabetes mellitus, heart disease, and stroke. The DASH eating plan may also help with weight loss. WHAT DO I NEED TO KNOW ABOUT THE DASH EATING PLAN? For the DASH eating plan, you will follow these general guidelines:  Choose foods with a percent daily value for sodium of less than 5% (as listed on the food label).  Use salt-free seasonings or herbs instead of table salt or sea salt.  Check with your health care provider or pharmacist before using salt substitutes.  Eat lower-sodium products, often labeled as "lower sodium" or "no salt added."  Eat fresh foods.  Eat more vegetables, fruits, and low-fat dairy  products.  Choose whole grains. Look for the word "whole" as the first word in the ingredient list.  Choose fish and skinless chicken or [place] more often than red meat. Limit fish, poultry, and meat to 6 oz (170 g) each day.  Limit sweets, desserts, sugars, and sugary drinks.  Choose heart-healthy fats.  Limit cheese to 1 oz (28 g) per day.  Eat more home-cooked food and less restaurant, buffet, and fast food.  Limit fried foods.  Cook foods using methods other than frying.  Limit canned vegetables. If you do use them, rinse them well to decrease the sodium.  When eating at a restaurant, ask that your food be prepared with less salt, or no salt if possible. WHAT FOODS CAN I EAT? Seek help from a dietitian for individual calorie needs. Grains Whole grain or whole wheat bread. Brown rice. Whole grain or whole wheat pasta. Quinoa, bulgur, and whole grain cereals. Low-sodium cereals. Corn or whole wheat flour tortillas. Whole grain cornbread. Whole grain crackers. Low-sodium crackers. Vegetables Fresh or frozen vegetables (raw, steamed, roasted, or grilled). Low-sodium or reduced-sodium tomato and vegetable juices. Low-sodium or reduced-sodium tomato sauce and paste. Low-sodium or reduced-sodium canned vegetables.  Fruits All fresh, canned (in natural juice), or frozen fruits. Meat and Other Protein Products Ground beef (85% or leaner), grass-fed beef, or beef trimmed of fat. Skinless chicken or [place]. Ground chicken or [place]. Pork trimmed of fat. All fish and seafood. Eggs. Dried  beans, peas, or lentils. Unsalted nuts and seeds. Unsalted canned beans. Dairy Low-fat dairy products, such as skim or 1% milk, 2% or reduced-fat cheeses, low-fat ricotta or cottage cheese, or plain low-fat yogurt. Low-sodium or reduced-sodium cheeses. Fats and Oils Tub margarines without trans fats. Light or reduced-fat mayonnaise and salad dressings (reduced sodium). Avocado. Safflower, olive, or canola  oils. Natural peanut or almond butter. Other Unsalted popcorn and pretzels. The items listed above may not be a complete list of recommended foods or beverages. Contact your dietitian for more options. WHAT FOODS ARE NOT RECOMMENDED? Grains White bread. White pasta. White rice. Refined cornbread. Bagels and croissants. Crackers that contain trans fat. Vegetables Creamed or fried vegetables. Vegetables in a cheese sauce. Regular canned vegetables. Regular canned tomato sauce and paste. Regular tomato and vegetable juices. Fruits Dried fruits. Canned fruit in light or heavy syrup. Fruit juice. Meat and Other Protein Products Fatty cuts of meat. Ribs, chicken wings, bacon, sausage, bologna, salami, chitterlings, fatback, hot dogs, bratwurst, and packaged luncheon meats. Salted nuts and seeds. Canned beans with salt. Dairy Whole or 2% milk, cream, half-and-half, and cream cheese. Whole-fat or sweetened yogurt. Full-fat cheeses or blue cheese. Nondairy creamers and whipped toppings. Processed cheese, cheese spreads, or cheese curds. Condiments Onion and garlic salt, seasoned salt, table salt, and sea salt. Canned and packaged gravies. Worcestershire sauce. Tartar sauce. Barbecue sauce. Teriyaki sauce. Soy sauce, including reduced sodium. Steak sauce. Fish sauce. Oyster sauce. Cocktail sauce. Horseradish. Ketchup and mustard. Meat flavorings and tenderizers. Bouillon cubes. Hot sauce. Tabasco sauce. Marinades. Taco seasonings. Relishes. Fats and Oils Butter, stick margarine, lard, shortening, ghee, and bacon fat. Coconut, palm kernel, or palm oils. Regular salad dressings. Other Pickles and olives. Salted popcorn and pretzels. The items listed above may not be a complete list of foods and beverages to avoid. Contact your dietitian for more information. WHERE CAN I FIND MORE INFORMATION? National Heart, Lung, and Blood Institute: travelstabloid.com Document Released:  08/08/2011 Document Revised: 01/03/2014 Document Reviewed: 06/23/2013 [facility] Patient Information 2015 [place], [place]. This information is not intended to replace advice given to you by your health care provider. Make sure you discuss any questions you have with your health care provider.        Why follow it? Research shows. . Those who follow the Mediterranean diet have a reduced risk of heart disease  . The diet is associated with a reduced incidence of Parkinson's and Alzheimer's diseases . People following the diet may have longer life expectancies and lower rates of chronic diseases  . The Dietary Guidelines for Americans recommends the Mediterranean diet as an eating plan to promote health and prevent disease  What Is the Mediterranean Diet?  . Healthy eating plan based on typical foods and recipes of Mediterranean-style cooking . The diet is primarily a plant based diet; these foods should make up a majority of meals   Starches - Plant based foods should make up a majority of meals - They are an important sources of vitamins, minerals, energy, antioxidants, and fiber - Choose whole grains, foods high in fiber and minimally processed items  - Typical grain sources include wheat, oats, barley, corn, brown rice, bulgar, farro, millet, polenta, couscous  - Various types of beans include chickpeas, lentils, fava beans, black beans, white beans   Fruits  Veggies - Large quantities of antioxidant rich fruits & veggies; 6 or more servings  - Vegetables can be eaten raw or lightly drizzled with oil and cooked  -  Vegetables common to the traditional Mediterranean Diet include: artichokes, arugula, beets, broccoli, brussel sprouts, cabbage, carrots, celery, collard greens, cucumbers, eggplant, kale, leeks, lemons, lettuce, mushrooms, okra, onions, peas, peppers, potatoes, pumpkin, radishes, rutabaga, shallots, spinach, sweet potatoes, turnips, zucchini - Fruits common to the Mediterranean Diet  include: apples, apricots, avocados, cherries, clementines, dates, figs, grapefruits, grapes, melons, nectarines, oranges, peaches, pears, pomegranates, strawberries, tangerines  Fats - Replace butter and margarine with healthy oils, such as olive oil, canola oil, and tahini  - Limit nuts to no more than a handful a day  - Nuts include walnuts, almonds, pecans, pistachios, pine nuts  - Limit or avoid candied, honey roasted or heavily salted nuts - Olives are central to the [employer] - can be eaten whole or used in a variety of dishes   Meats Protein - Limiting red meat: no more than a few times a month - When eating red meat: choose lean cuts and keep the portion to the size of deck of cards - Eggs: approx. 0 to 4 times a week  - Fish and lean poultry: at least 2 a week  - Healthy protein sources include, chicken, [place], lean beef, lamb - Increase intake of seafood such as tuna, salmon, trout, mackerel, shrimp, scallops - Avoid or limit high fat processed meats such as sausage and bacon  Dairy - Include moderate amounts of low fat dairy products  - Focus on healthy dairy such as fat free yogurt, skim milk, low or reduced fat cheese - Limit dairy products higher in fat such as whole or 2% milk, cheese, ice cream  Alcohol - Moderate amounts of red wine is ok  - No more than 5 oz daily for women (all ages) and men older than age 44  - No more than 10 oz of wine daily for men younger than 70  Other - Limit sweets and other desserts  - Use herbs and spices instead of salt to flavor foods  - Herbs and spices common to the traditional Mediterranean Diet include: basil, bay leaves, chives, cloves, cumin, fennel, garlic, lavender, marjoram, mint, oregano, parsley, pepper, rosemary, sage, savory, sumac, tarragon, thyme   It's not just a diet, it's a lifestyle:  . The Mediterranean diet includes lifestyle factors typical of those in the region  . Foods, drinks and meals are best eaten with  others and savored . Daily physical activity is important for overall good health . This could be strenuous exercise like running and aerobics . This could also be more leisurely activities such as walking, housework, yard-work, or taking the stairs . Moderation is the key; a balanced and healthy diet accommodates most foods and drinks . Consider portion sizes and frequency of consumption of certain foods   Meal Ideas & Options:  . Breakfast:  o Whole wheat toast or whole wheat English muffins with peanut butter & hard boiled egg o Steel cut oats topped with apples & cinnamon and skim milk  o Fresh fruit: banana, strawberries, melon, berries, peaches  o Smoothies: strawberries, bananas, greek yogurt, peanut butter o Low fat greek yogurt with blueberries and granola  o Egg white omelet with spinach and mushrooms o Breakfast couscous: whole wheat couscous, apricots, skim milk, cranberries  . Sandwiches:  o Hummus and grilled vegetables (peppers, zucchini, squash) on whole wheat bread   o Grilled chicken on whole wheat pita with lettuce, tomatoes, cucumbers or tzatziki  o Tuna salad on whole wheat bread: tuna salad made with greek yogurt,  olives, red peppers, capers, green onions o Garlic rosemary lamb pita: lamb sauted with garlic, rosemary, salt & pepper; add lettuce, cucumber, greek yogurt to pita - flavor with lemon juice and black pepper  . Seafood:  o Mediterranean grilled salmon, seasoned with garlic, basil, parsley, lemon juice and black pepper o Shrimp, lemon, and spinach whole-grain pasta salad made with low fat greek yogurt  o Seared scallops with lemon orzo  o Seared tuna steaks seasoned salt, pepper, coriander topped with tomato mixture of olives, tomatoes, olive oil, minced garlic, parsley, green onions and cappers  . Meats:  o Herbed greek chicken salad with kalamata olives, cucumber, feta  o Red bell peppers stuffed with spinach, bulgur, lean ground beef (or lentils) &  topped with feta   o Kebabs: skewers of chicken, tomatoes, onions, zucchini, squash  o [place] burgers: made with red onions, mint, dill, lemon juice, feta cheese topped with roasted red peppers . Vegetarian o Cucumber salad: cucumbers, artichoke hearts, celery, red onion, feta cheese, tossed in olive oil & lemon juice  o Hummus and whole grain pita points with a greek salad (lettuce, tomato, feta, olives, cucumbers, red onion) o Lentil soup with celery, carrots made with vegetable broth, garlic, salt and pepper  o Tabouli salad: parsley, bulgur, mint, scallions, cucumbers, tomato, radishes, lemon juice, olive oil, salt and pepper.

## 2016-07-07 ENCOUNTER — Other Ambulatory Visit: Payer: Self-pay | Admitting: Medical

## 2016-07-08 ENCOUNTER — Other Ambulatory Visit: Payer: Self-pay

## 2016-07-17 NOTE — Telephone Encounter (Signed)
P.A. Approved per Cover my meds, called pharmacy & pt picked up already

## 2016-07-30 ENCOUNTER — Other Ambulatory Visit: Payer: Self-pay | Admitting: Medical

## 2016-08-21 ENCOUNTER — Telehealth: Payer: Self-pay | Admitting: Medical

## 2016-08-21 ENCOUNTER — Other Ambulatory Visit: Payer: Self-pay | Admitting: Medical

## 2016-08-21 MED ORDER — SILDENAFIL CITRATE 100 MG PO TABS: ORAL_TABLET | ORAL | 0 refills | Status: DC

## 2016-08-21 NOTE — Telephone Encounter (Signed)
Pt's wife called and stated pt can get viagra a lot cheaper at [facility]. They needs a written copy faxed to them at [phone].

## 2016-08-21 NOTE — Telephone Encounter (Signed)
Fax rx

## 2016-08-21 NOTE — Telephone Encounter (Signed)
Pt informed rx faxed.

## 2016-11-21 ENCOUNTER — Other Ambulatory Visit: Payer: Self-pay | Admitting: Medical

## 2016-11-21 NOTE — Telephone Encounter (Signed)
Is this okay to refill? 

## 2017-04-18 ENCOUNTER — Other Ambulatory Visit: Payer: Self-pay | Admitting: Medical

## 2017-05-26 ENCOUNTER — Encounter: Payer: Self-pay | Admitting: Medical

## 2017-05-26 ENCOUNTER — Ambulatory Visit (INDEPENDENT_AMBULATORY_CARE_PROVIDER_SITE_OTHER): Payer: Commercial Managed Care - PPO | Admitting: Medical

## 2017-05-26 VITALS — BP 152/84 | HR 62 | Ht 73.0 in | Wt 199.6 lb

## 2017-05-26 DIAGNOSIS — I10 Essential (primary) hypertension: Secondary | ICD-10-CM

## 2017-05-26 DIAGNOSIS — L989 Disorder of the skin and subcutaneous tissue, unspecified: Secondary | ICD-10-CM | POA: Diagnosis not present

## 2017-05-26 DIAGNOSIS — I517 Cardiomegaly: Secondary | ICD-10-CM | POA: Diagnosis not present

## 2017-05-26 DIAGNOSIS — Z87891 Personal history of nicotine dependence: Secondary | ICD-10-CM | POA: Diagnosis not present

## 2017-05-26 DIAGNOSIS — Z8249 Family history of ischemic heart disease and other diseases of the circulatory system: Secondary | ICD-10-CM | POA: Diagnosis not present

## 2017-05-26 DIAGNOSIS — R7989 Other specified abnormal findings of blood chemistry: Secondary | ICD-10-CM

## 2017-05-26 DIAGNOSIS — Z1159 Encounter for screening for other viral diseases: Secondary | ICD-10-CM

## 2017-05-26 DIAGNOSIS — N529 Male erectile dysfunction, unspecified: Secondary | ICD-10-CM | POA: Diagnosis not present

## 2017-05-26 DIAGNOSIS — Z136 Encounter for screening for cardiovascular disorders: Secondary | ICD-10-CM | POA: Insufficient documentation

## 2017-05-26 DIAGNOSIS — Z7189 Other specified counseling: Secondary | ICD-10-CM | POA: Diagnosis not present

## 2017-05-26 DIAGNOSIS — R7301 Impaired fasting glucose: Secondary | ICD-10-CM | POA: Diagnosis not present

## 2017-05-26 DIAGNOSIS — Z0000 Encounter for general adult medical examination without abnormal findings: Secondary | ICD-10-CM

## 2017-05-26 DIAGNOSIS — R945 Abnormal results of liver function studies: Secondary | ICD-10-CM

## 2017-05-26 DIAGNOSIS — Z7185 Encounter for immunization safety counseling: Secondary | ICD-10-CM

## 2017-05-26 DIAGNOSIS — Z23 Encounter for immunization: Secondary | ICD-10-CM

## 2017-05-26 LAB — POCT URINALYSIS DIP (PROADVANTAGE DEVICE)
Bilirubin, UA: NEGATIVE
Glucose, UA: NEGATIVE mg/dL
Ketones, POC UA: NEGATIVE mg/dL
LEUKOCYTES UA: NEGATIVE
NITRITE UA: NEGATIVE
PH UA: 6 (ref 5.0–8.0)
PROTEIN UA: NEGATIVE mg/dL
RBC UA: NEGATIVE
Specific Gravity, Urine: 1.03
Urobilinogen, Ur: NEGATIVE

## 2017-05-26 MED ORDER — LOSARTAN POTASSIUM-HCTZ 50-12.5 MG PO TABS: 1.0000 | ORAL_TABLET | Freq: Every day | ORAL | 3 refills | Status: DC

## 2017-05-26 MED ORDER — SILDENAFIL CITRATE 100 MG PO TABS: 50.0000 mg | ORAL_TABLET | Freq: Every day | ORAL | 3 refills | Status: DC | PRN

## 2017-05-26 NOTE — Patient Instructions (Signed)
Recommendations:  I increased your blood pressure medication to Losartan HCT 50/12.5mg  daily  EXERCISE!  Eat a healthy low fat diet See your eye doctor yearly for routine vision care.  See your dentist yearly for routine dental care including hygiene visits twice yearly.  I recommend you have a shingles vaccine to help prevent shingles or herpes zoster outbreak.   Please call your insurer to inquire about coverage for the Shingrix vaccine given in 2 doses.   Some insurers cover this vaccine after age 97, some cover this after age 50.  If your insurer covers this, then call to schedule appointment to have this vaccine here.  I may consider adding a cholesterol mediation at bedtime to lower cholesterol and thus lower heart disease risks

## 2017-05-26 NOTE — Progress Notes (Signed)
Subjective:   HPI  Calvin Reid is a 51 y.o. male who presents for a complete physical.    Medical team: Carlena Hurl, PA-C here for primary care Dr. Kennedy Bucker, GI Dr. Peter Martinique, cardiology  Wants to try different medication for ED.   Generic Viagra gave him headaches and sluggish feeling.  Wants to go back to name brand Viagra  Compliant with HTN medication  Reviewed their medical, surgical, family, social, medication, and allergy history and updated chart as appropriate.  Past Medical History:  Diagnosis Date  . Elevated LFTs 07/2016  . Former smoker    quit 04/2016  . H/O cardiovascular stress test 03/08/2009   stress echo normal.   Dr. Peter Martinique  . Hypertension   . Impaired fasting blood sugar   . Wears partial dentures     Past Surgical History:  Procedure Laterality Date  . COLONOSCOPY  06/11/2016   repeat 2027.   Dr. Kennedy Bucker, colon polyps  . NO PAST SURGERIES  04/2016  . WISDOM TOOTH EXTRACTION      Social History   Social History  . Marital status: Single    Spouse name: N/A  . Number of children: 2  . Years of education: N/A   Occupational History  . [employer] manager    Social History Main Topics  . Smoking status: Former Smoker    Packs/day: 0.50    Years: 25.00    Types: Cigarettes    Quit date: 04/21/2016  . Smokeless tobacco: Never Used  . Alcohol use 2.4 oz/week    4 Cans of beer per week  . Drug use: No  . Sexual activity: Not on file   Other Topics Concern  . 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   3 sons.   Divorced.   As of 04/2016.    Family History  Problem Relation Age of Onset  . Hypertension Mother   . COPD Father        died in his 32s  . Heart disease Sister 78       CAD  . Heart disease Son        arrhythmia  . Heart disease Son   . Diabetes Brother   . Stroke Neg Hx   . Cancer Neg Hx   . Colon cancer Neg Hx      Current Outpatient Prescriptions:   .  aspirin 81 MG EC tablet, TAKE 1 TABLET (81 MG TOTAL) BY MOUTH DAILY., Disp: 90 tablet, Rfl: 2 .  losartan-hydrochlorothiazide (HYZAAR) 50-12.5 MG tablet, Take 1 tablet by mouth daily., Disp: 90 tablet, Rfl: 3 .  sildenafil (VIAGRA) 100 MG tablet, 1/2-1 tablet po daily prn, Disp: 90 tablet, Rfl: 0 .  sildenafil (VIAGRA) 100 MG tablet, Take 0.5-1 tablets (50-100 mg total) by mouth daily as needed for erectile dysfunction., Disp: 5 tablet, Rfl: 3  Current Facility-Administered Medications:  .  0.9 %  sodium chloride infusion, 500 mL, Intravenous, Continuous, Ladene Artist, MD  No Known Allergies   Review of Systems Constitutional: -fever, -chills, -sweats, -unexpected weight change, -decreased appetite, -fatigue Allergy: -sneezing, -itching, -congestion Dermatology: -changing moles, --rash, -lumps ENT: -runny nose, -ear pain, -sore throat, -hoarseness, -sinus pain, -teeth pain, - ringing in ears, -hearing loss, -nosebleeds Cardiology: -chest pain, -palpitations, -swelling, -difficulty breathing when lying flat, -waking up short of breath Respiratory: -cough, -shortness of breath, -difficulty breathing with exercise or exertion, -wheezing, -  coughing up blood Gastroenterology: -abdominal pain, -nausea, -vomiting, -diarrhea, -constipation, -blood in stool, -changes in bowel movement, -difficulty swallowing or eating Hematology: -bleeding, -bruising  Musculoskeletal: -joint aches, -muscle aches, -joint swelling, -back pain, -neck pain, -cramping, -changes in gait Ophthalmology: denies vision changes, eye redness, itching, discharge Urology: -nocturia,  -burning with urination, -difficulty urinating, -blood in urine, -urinary frequency, -urgency, -incontinence Neurology: -headache, -weakness, -tingling, -numbness, -memory loss, -falls, -dizziness Psychology: -depressed mood, -agitation, -sleep problems     Objective:   Physical Exam  BP (!) 152/84   Pulse 62   Ht 6\' 1"  (1.854 m)   Wt  199 lb 9.6 oz (90.5 kg)   SpO2 99%   BMI 26.33 kg/m   General appearance: alert, no distress, WD/WN, AA male Skin: right preauricular area with brown raised 67mm round lesion unchanged for years per patient.   Few scattered macules.  No worrisome lesions HEENT: normocephalic, conjunctiva/corneas normal, sclerae anicteric, PERRLA, EOMi, nares patent, no discharge or erythema, pharynx normal Oral cavity: MMM, tongue normal, teeth - upper denture Neck: supple, no lymphadenopathy, no thyromegaly, no masses, normal ROM, no bruits Chest: upper center of chest with horizontal keloid scar, approx 3cm long, non tender, normal shape and expansion Heart: RRR, normal S1, S2, no murmurs Lungs: CTA bilaterally, no wheezes, rhonchi, or rales Abdomen: +bs, soft, non tender, non distended, no masses, no hepatomegaly, no splenomegaly, no bruits Back: non tender, normal ROM, no scoliosis Musculoskeletal: upper extremities non tender, no obvious deformity, normal ROM throughout, lower extremities non tender, no obvious deformity, normal ROM throughout Extremities: no edema, no cyanosis, no clubbing Pulses: 2+ symmetric, upper and lower extremities, normal cap refill Neurological: alert, oriented x 3, CN2-12 intact, strength normal upper extremities and lower extremities, sensation normal throughout, DTRs 2+ throughout, no cerebellar signs, gait normal Psychiatric: normal affect, behavior normal, pleasant  GU: normal male external genitalia, nontender, no masses, no hernia, no lymphadenopathy Rectal: deferred   Assessment and Plan :    Encounter Diagnoses  Name Primary?  . Routine general medical examination at a health care facility Yes  . Need for influenza vaccination   . Essential hypertension   . LVH (left ventricular hypertrophy)   . Impaired fasting blood sugar   . Erectile dysfunction, unspecified erectile dysfunction type   . Family history of premature CAD   . Elevated LFTs   . Former smoker    . Vaccine counseling   . Encounter for hepatitis C screening test for low risk patient   . Skin lesion     Physical exam - discussed healthy lifestyle, diet, exercise, preventative care, vaccinations, and addressed their concerns.   See your eye doctor yearly for routine vision care. See your dentist yearly for routine dental care including hygiene visits twice yearly. Reviewed the 2017 colonoscopy and 2017 echocardiogram Counseled on the influenza virus vaccine.  Vaccine information sheet given.  Influenza vaccine given after consent obtained. BP - increase to Losartan HCT 50/12.5mg  daily Erectile Dysfunction - Reviewed pathophysiology and differential diagnosis of erectile dysfunction with the patient.  Discussed treatment options.  Discussed potential risks of medications including hypotension and priapism.  Discussed proper use of medication.  Refilled name brand Viagra. Questions were answered.  Recheck prn Referral to dermatology for right face skin lesion, as it get knicked with shaving, bleeds at times Follow-up pending labs  Atha was seen today for annual exam.  Diagnoses and all orders for this visit:  Routine general medical examination at a health care facility -  POCT Urinalysis DIP (Proadvantage Device) -     Lipid panel -     Hemoglobin A1c -     CBC -     Comprehensive metabolic panel -     Hepatitis B surface antigen -     Hepatitis C antibody  Need for influenza vaccination -     Flu Vaccine QUAD 36+ mos IM  Essential hypertension -     losartan-hydrochlorothiazide (HYZAAR) 50-12.5 MG tablet; Take 1 tablet by mouth daily.  LVH (left ventricular hypertrophy)  Impaired fasting blood sugar -     Hemoglobin A1c  Erectile dysfunction, unspecified erectile dysfunction type -     sildenafil (VIAGRA) 100 MG tablet; Take 0.5-1 tablets (50-100 mg total) by mouth daily as needed for erectile dysfunction.  Family history of premature CAD  Elevated LFTs -      Comprehensive metabolic panel -     Hepatitis B surface antigen -     Hepatitis C antibody  Former smoker  Vaccine counseling  Encounter for hepatitis C screening test for low risk patient -     Hepatitis B surface antigen -     Hepatitis C antibody  Skin lesion -     Ambulatory referral to Dermatology

## 2017-05-27 ENCOUNTER — Other Ambulatory Visit: Payer: Self-pay | Admitting: Medical

## 2017-05-27 LAB — HEPATITIS C ANTIBODY
Hepatitis C Ab: NONREACTIVE
SIGNAL TO CUT-OFF: 0.01 (ref <1.00)

## 2017-05-27 LAB — CBC
HCT: 40 % (ref 38.5–50.0)
Hemoglobin: 12.9 g/dL — ABNORMAL LOW (ref 13.2–17.1)
MCH: 27.6 pg (ref 27.0–33.0)
MCHC: 32.3 g/dL (ref 32.0–36.0)
MCV: 85.5 fL (ref 80.0–100.0)
MPV: 12.4 fL (ref 7.5–12.5)
PLATELETS: 227 10*3/uL (ref 140–400)
RBC: 4.68 10*6/uL (ref 4.20–5.80)
RDW: 13.2 % (ref 11.0–15.0)
WBC: 4 10*3/uL (ref 3.8–10.8)

## 2017-05-27 LAB — COMPREHENSIVE METABOLIC PANEL
AG Ratio: 1.9 (calc) (ref 1.0–2.5)
ALBUMIN MSPROF: 4.5 g/dL (ref 3.6–5.1)
ALT: 33 U/L (ref 9–46)
AST: 29 U/L (ref 10–35)
Alkaline phosphatase (APISO): 73 U/L (ref 40–115)
BILIRUBIN TOTAL: 0.7 mg/dL (ref 0.2–1.2)
BUN: 18 mg/dL (ref 7–25)
CALCIUM: 9.7 mg/dL (ref 8.6–10.3)
CO2: 24 mmol/L (ref 20–32)
CREATININE: 1.01 mg/dL (ref 0.70–1.33)
Chloride: 108 mmol/L (ref 98–110)
GLUCOSE: 95 mg/dL (ref 65–99)
Globulin: 2.4 g/dL (calc) (ref 1.9–3.7)
POTASSIUM: 4.4 mmol/L (ref 3.5–5.3)
SODIUM: 141 mmol/L (ref 135–146)
TOTAL PROTEIN: 6.9 g/dL (ref 6.1–8.1)

## 2017-05-27 LAB — HEMOGLOBIN A1C
EAG (MMOL/L): 7 (calc)
HEMOGLOBIN A1C: 6 %{Hb} — AB (ref <5.7)
MEAN PLASMA GLUCOSE: 126 (calc)

## 2017-05-27 LAB — LIPID PANEL
CHOL/HDL RATIO: 4.3 (calc) (ref <5.0)
CHOLESTEROL: 178 mg/dL (ref <200)
HDL: 41 mg/dL (ref >40)
LDL CHOLESTEROL (CALC): 122 mg/dL — AB
Non-HDL Cholesterol (Calc): 137 mg/dL (calc) — ABNORMAL HIGH (ref <130)
Triglycerides: 60 mg/dL (ref <150)

## 2017-05-27 LAB — HEPATITIS B SURFACE ANTIGEN: Hepatitis B Surface Ag: NONREACTIVE

## 2017-05-27 MED ORDER — PRAVASTATIN SODIUM 20 MG PO TABS: 20.0000 mg | ORAL_TABLET | Freq: Every evening | ORAL | 0 refills | Status: DC

## 2017-06-09 ENCOUNTER — Encounter: Payer: Self-pay | Admitting: Medical

## 2017-06-11 ENCOUNTER — Other Ambulatory Visit: Payer: Self-pay | Admitting: Medical

## 2017-06-11 MED ORDER — SILDENAFIL CITRATE 100 MG PO TABS: ORAL_TABLET | ORAL | 0 refills | Status: DC

## 2017-06-12 ENCOUNTER — Other Ambulatory Visit: Payer: Self-pay

## 2017-07-23 ENCOUNTER — Telehealth: Payer: Self-pay | Admitting: Medical

## 2017-07-23 ENCOUNTER — Other Ambulatory Visit: Payer: Self-pay | Admitting: Medical

## 2017-07-23 MED ORDER — PRAVASTATIN SODIUM 20 MG PO TABS: 20.0000 mg | ORAL_TABLET | Freq: Every evening | ORAL | 0 refills | Status: DC

## 2017-07-23 NOTE — Telephone Encounter (Signed)
Then deny the rx if that is what you want to happen, I can not do that

## 2017-07-23 NOTE — Telephone Encounter (Signed)
This shouldn't need refill.   I sent for #90 last visit with intention of f/u appt before the 90 days is up to check how well this is working.

## 2017-07-23 NOTE — Telephone Encounter (Signed)
Refill request Pravastatin 20mg  #90

## 2017-08-13 DIAGNOSIS — L816 Other disorders of diminished melanin formation: Secondary | ICD-10-CM | POA: Diagnosis not present

## 2017-08-13 DIAGNOSIS — D485 Neoplasm of uncertain behavior of skin: Secondary | ICD-10-CM | POA: Diagnosis not present

## 2017-08-13 DIAGNOSIS — D225 Melanocytic nevi of trunk: Secondary | ICD-10-CM | POA: Diagnosis not present

## 2017-09-09 ENCOUNTER — Encounter: Payer: Self-pay | Admitting: Medical

## 2017-09-11 ENCOUNTER — Other Ambulatory Visit: Payer: Self-pay | Admitting: Medical

## 2017-09-11 ENCOUNTER — Ambulatory Visit
Admission: RE | Admit: 2017-09-11 | Discharge: 2017-09-11 | Disposition: A | Discharge status: Home / Self Care | Payer: Commercial Managed Care - PPO | Source: Ambulatory Visit | Attending: Medical | Admitting: Medical

## 2017-09-11 ENCOUNTER — Ambulatory Visit (INDEPENDENT_AMBULATORY_CARE_PROVIDER_SITE_OTHER): Payer: Commercial Managed Care - PPO | Admitting: Medical

## 2017-09-11 ENCOUNTER — Encounter: Payer: Self-pay | Admitting: Medical

## 2017-09-11 VITALS — BP 126/80 | HR 59 | Temp 98.3°F | Wt 206.0 lb

## 2017-09-11 DIAGNOSIS — Z23 Encounter for immunization: Secondary | ICD-10-CM | POA: Diagnosis not present

## 2017-09-11 DIAGNOSIS — F17200 Nicotine dependence, unspecified, uncomplicated: Secondary | ICD-10-CM

## 2017-09-11 DIAGNOSIS — R062 Wheezing: Secondary | ICD-10-CM | POA: Diagnosis not present

## 2017-09-11 DIAGNOSIS — R05 Cough: Secondary | ICD-10-CM | POA: Diagnosis not present

## 2017-09-11 DIAGNOSIS — R059 Cough, unspecified: Secondary | ICD-10-CM | POA: Insufficient documentation

## 2017-09-11 DIAGNOSIS — I517 Cardiomegaly: Secondary | ICD-10-CM | POA: Diagnosis not present

## 2017-09-11 DIAGNOSIS — R7301 Impaired fasting glucose: Secondary | ICD-10-CM | POA: Diagnosis not present

## 2017-09-11 DIAGNOSIS — I10 Essential (primary) hypertension: Secondary | ICD-10-CM

## 2017-09-11 DIAGNOSIS — E785 Hyperlipidemia, unspecified: Secondary | ICD-10-CM | POA: Diagnosis not present

## 2017-09-11 LAB — COMPREHENSIVE METABOLIC PANEL
AG RATIO: 1.8 (calc) (ref 1.0–2.5)
ALKALINE PHOSPHATASE (APISO): 72 U/L (ref 40–115)
ALT: 52 U/L — ABNORMAL HIGH (ref 9–46)
AST: 33 U/L (ref 10–35)
Albumin: 4.9 g/dL (ref 3.6–5.1)
BUN: 19 mg/dL (ref 7–25)
CHLORIDE: 106 mmol/L (ref 98–110)
CO2: 24 mmol/L (ref 20–32)
CREATININE: 1.02 mg/dL (ref 0.70–1.33)
Calcium: 10.1 mg/dL (ref 8.6–10.3)
Globulin: 2.7 g/dL (calc) (ref 1.9–3.7)
Glucose, Bld: 101 mg/dL — ABNORMAL HIGH (ref 65–99)
Potassium: 4.5 mmol/L (ref 3.5–5.3)
Sodium: 139 mmol/L (ref 135–146)
Total Bilirubin: 0.5 mg/dL (ref 0.2–1.2)
Total Protein: 7.6 g/dL (ref 6.1–8.1)

## 2017-09-11 LAB — LIPID PANEL
Cholesterol: 164 mg/dL (ref <200)
HDL: 44 mg/dL (ref >40)
LDL CHOLESTEROL (CALC): 105 mg/dL — AB
NON-HDL CHOLESTEROL (CALC): 120 mg/dL (ref <130)
TRIGLYCERIDES: 65 mg/dL (ref <150)
Total CHOL/HDL Ratio: 3.7 (calc) (ref <5.0)

## 2017-09-11 MED ORDER — ALBUTEROL SULFATE HFA 108 (90 BASE) MCG/ACT IN AERS: 2.0000 | INHALATION_SPRAY | Freq: Four times a day (QID) | RESPIRATORY_TRACT | 0 refills | Status: DC | PRN

## 2017-09-11 MED ORDER — PREDNISONE 20 MG PO TABS: ORAL_TABLET | ORAL | 0 refills | Status: DC

## 2017-09-11 NOTE — Addendum Note (Signed)
Addended by: Tyrone Apple on: 09/11/2017 09:53 AM   Modules accepted: Orders

## 2017-09-11 NOTE — Progress Notes (Signed)
Subjective Chief Complaint  Patient presents with  . med check    med check , and coughing at night x 1 month    Here for med check.   I last saw him in 05/2017 for physical  At that time we begin Pravachol for high cholesterol.  He is compliant without and side effects, trying to eat healthy.  Last visit I referred to dermatology for skin lesions.   Had lesion on right face removed.  Still has keloid on chest he forgot to ask them about.   He has been having some cough, congestion, and wheezing.  Drives fork lift, in and out of the cold and hot.   Been amount a month.   Wife said she hears him breath heavy and wheezing.   No calve pain, no swelling.  Smokes some, cigars occasionally.  Has some productive cough.   No fever.   No hemoptysis.   No hx/o asthma.     He did call insurance about Shingrix vaccine and they do cover this.     Past Medical History:  Diagnosis Date  . Elevated LFTs 07/2016  . Former smoker    quit 04/2016  . H/O cardiovascular stress test 03/08/2009   stress echo normal.   Dr. Peter Martinique  . Hypertension   . Impaired fasting blood sugar   . Wears partial dentures    Current Outpatient Medications on File Prior to Visit  Medication Sig Dispense Refill  . aspirin 81 MG EC tablet TAKE 1 TABLET (81 MG TOTAL) BY MOUTH DAILY. 90 tablet 2  . losartan-hydrochlorothiazide (HYZAAR) 50-12.5 MG tablet Take 1 tablet by mouth daily. 90 tablet 3  . pravastatin (PRAVACHOL) 20 MG tablet Take 1 tablet (20 mg total) by mouth every evening. 90 tablet 0  . sildenafil (VIAGRA) 100 MG tablet 1/2-1 tablet po daily prn 30 tablet 0   Current Facility-Administered Medications on File Prior to Visit  Medication Dose Route Frequency Provider Last Rate Last Dose  . 0.9 %  sodium chloride infusion  500 mL Intravenous Continuous Ladene Artist, MD       ROS as in subjective   Objective BP 126/80   Pulse (!) 59   Temp 98.3 F (36.8 C)   Wt 206 lb (93.4 kg)   SpO2 99%   BMI  27.18 kg/m   Wt Readings from Last 3 Encounters:  09/11/17 206 lb (93.4 kg)  05/26/17 199 lb 9.6 oz (90.5 kg)  07/03/16 203 lb 9.6 oz (92.4 kg)   General appearance: alert, no distress, WD/WN,  HEENT: normocephalic, sclerae anicteric, TMs pearly, nares patent, no discharge or erythema, pharynx normal Oral cavity: MMM, no lesions Neck: supple, no lymphadenopathy, no thyromegaly, no masses Heart: RRR, normal S1, S2, no murmurs Lungs: scattered faint wheezes,decreased breath sounds in general, rhonchi, or rales Abdomen: +bs, soft, non tender, non distended, no masses, no hepatomegaly, no splenomegaly Pulses: 2+ symmetric, upper and lower extremities, normal cap refill Ext: no edema    Assessment: Encounter Diagnoses  Name Primary?  . Cough Yes  . Wheezing   . Essential hypertension   . LVH (left ventricular hypertrophy)   . Impaired fasting blood sugar   . Hyperlipidemia, unspecified hyperlipidemia type   . Smoker   . Need for shingles vaccine      Plan: Cough, wheezing, smoker-go for chest x-ray today.  I will prescribe an albuterol inhaler, discussed proper use.  Follow-up pending chest x-ray results  Counseled on the Shingrix  vaccine.  Vaccine information sheet given. Shingrix #1 vaccine given after consent obtained.   Return in 2 months for Shingrix #2.  Hypertension-continue same medication  Hyperlipidemia-labs today since starting Pravachol last visit, counseled on diet and exercise  Advised smoking cessation.  He smokes cigars occasionally.  Not a daily smoker  Lashon was seen today for med check.  Diagnoses and all orders for this visit:  Cough -     DG Chest 2 View; Future  Wheezing -     DG Chest 2 View; Future  Essential hypertension  LVH (left ventricular hypertrophy)  Impaired fasting blood sugar  Hyperlipidemia, unspecified hyperlipidemia type -     Lipid panel -     Comprehensive metabolic panel  Smoker  Need for shingles vaccine  Other  orders -     albuterol (PROVENTIL HFA;VENTOLIN HFA) 108 (90 Base) MCG/ACT inhaler; Inhale 2 puffs into the lungs every 6 (six) hours as needed for wheezing or shortness of breath.

## 2017-09-12 ENCOUNTER — Other Ambulatory Visit: Payer: Self-pay | Admitting: Medical

## 2017-09-12 DIAGNOSIS — R945 Abnormal results of liver function studies: Principal | ICD-10-CM

## 2017-09-12 DIAGNOSIS — R7989 Other specified abnormal findings of blood chemistry: Secondary | ICD-10-CM

## 2017-09-12 MED ORDER — PRAVASTATIN SODIUM 20 MG PO TABS: 20.0000 mg | ORAL_TABLET | Freq: Every evening | ORAL | 3 refills | Status: DC

## 2017-09-30 ENCOUNTER — Ambulatory Visit (INDEPENDENT_AMBULATORY_CARE_PROVIDER_SITE_OTHER): Payer: Commercial Managed Care - PPO | Admitting: Medical

## 2017-09-30 VITALS — BP 144/80 | HR 72 | Wt 208.2 lb

## 2017-09-30 DIAGNOSIS — R0602 Shortness of breath: Secondary | ICD-10-CM

## 2017-09-30 DIAGNOSIS — R945 Abnormal results of liver function studies: Secondary | ICD-10-CM

## 2017-09-30 DIAGNOSIS — R7989 Other specified abnormal findings of blood chemistry: Secondary | ICD-10-CM

## 2017-09-30 DIAGNOSIS — R942 Abnormal results of pulmonary function studies: Secondary | ICD-10-CM

## 2017-09-30 DIAGNOSIS — Z87891 Personal history of nicotine dependence: Secondary | ICD-10-CM | POA: Diagnosis not present

## 2017-09-30 MED ORDER — ALBUTEROL SULFATE HFA 108 (90 BASE) MCG/ACT IN AERS: 2.0000 | INHALATION_SPRAY | Freq: Four times a day (QID) | RESPIRATORY_TRACT | 0 refills | Status: DC | PRN

## 2017-09-30 MED ORDER — UMECLIDINIUM-VILANTEROL 62.5-25 MCG/INH IN AEPB: 1.0000 | INHALATION_SPRAY | Freq: Every day | RESPIRATORY_TRACT | 2 refills | Status: DC

## 2017-09-30 NOTE — Progress Notes (Addendum)
Subjective: Chief Complaint  Patient presents with  . Follow-up    follow up and pft   I saw him 09/11/17 for cough, wheezing and bronchitis.   We had prescribed albuterol inhaler, sent  for CXR.  Advised smoking cessation.  Xray showed hyperinflation possibly related to asthma or COPD, but no other cardiopulmonary abnormality.  At that visit we had him use round of prednisone oral and inhaler. Used the inhaler 2-3 times daily for since then.     He is here for PFT at my request.   He does report significant improvement in breathing using the albuterol.  Still using the albuterol every other day since last visit.  He quit tobacco about a year ago.  Has hx/o 13-14 years of smoking, stopped for 1.5 years at one point in past.   Used about 1/2 ppd.  Can't smoke on the job.    No other aggravating or relieving factors. No other complaint.  Past Medical History:  Diagnosis Date  . Elevated LFTs 07/2016  . Former smoker    quit 04/2016  . H/O cardiovascular stress test 03/08/2009   stress echo normal.   Dr. Peter Martinique  . Hypertension   . Impaired fasting blood sugar   . Wears partial dentures    Current Outpatient Medications on File Prior to Visit  Medication Sig Dispense Refill  . aspirin 81 MG EC tablet TAKE 1 TABLET (81 MG TOTAL) BY MOUTH DAILY. 90 tablet 2  . losartan-hydrochlorothiazide (HYZAAR) 50-12.5 MG tablet Take 1 tablet by mouth daily. 90 tablet 3  . pravastatin (PRAVACHOL) 20 MG tablet Take 1 tablet (20 mg total) by mouth every evening. 90 tablet 3  . sildenafil (VIAGRA) 100 MG tablet 1/2-1 tablet po daily prn 30 tablet 0   Current Facility-Administered Medications on File Prior to Visit  Medication Dose Route Frequency Provider Last Rate Last Dose  . 0.9 %  sodium chloride infusion  500 mL Intravenous Continuous Ladene Artist, MD       ROS as in subjective   Objective: BP (!) 144/80   Pulse 72   Wt 208 lb 3.2 oz (94.4 kg)   SpO2 97%   BMI 27.47 kg/m   Gen: wd,  wn, nad Lungs clear Heart rrr, normal s1, s2, no murmurs Ext: no edema  PFT reviewed   Assessment: Encounter Diagnoses  Name Primary?  . Shortness of breath Yes  . Elevated LFTs   . Abnormal PFT   . Former smoker     Plan: Shortness of breath, abnormal PFT, former smoker-I reviewed his chest x-ray from last visit about 2 weeks ago, reviewed the PFT done today which is abnormal suggesting COPD.  We discussed the significance of the findings.  Thankfully he quit smoking about a year ago.  We discussed the need to remain abstinent from smoking.  He has seen improvement with albuterol inhaler.  Begin trial of Anoro inhaler once daily, continue albuterol as needed.  Discussed proper use of both medicines.  Advised follow-up in 3 months  Elevated LFTs-continue plan return for liver test in 2-3 months.  See last visit for additional information regarding this    Deklin was seen today for follow-up.  Diagnoses and all orders for this visit:  Shortness of breath -     Spirometry with Graph  Elevated LFTs  Abnormal PFT  Former smoker  Other orders -     umeclidinium-vilanterol (ANORO ELLIPTA) 62.5-25 MCG/INH AEPB; Inhale 1 puff into the lungs  daily. -     albuterol (PROVENTIL HFA;VENTOLIN HFA) 108 (90 Base) MCG/ACT inhaler; Inhale 2 puffs into the lungs every 6 (six) hours as needed for wheezing or shortness of breath.

## 2017-09-30 NOTE — Patient Instructions (Signed)
Your lung test today and the chest x-ray from last visit suggest COPD  I am glad that you have quit smoking about a year ago  Begin trial of Anoro preventative inhaler or maintenance inhaler 1 puff daily  You can continue the albuterol rescue inhaler as needed every 4-6 hours when you have wheezing, cough spells, shortness of breath  Pay attention to your breathing or exercise capacity over the next few months  I want to recheck in 3 months to see if you think this medication continues to help your breathing to see if we need to continue or not  We will plan to recheck your liver tests at that time as well    Chronic Obstructive Pulmonary Disease Chronic obstructive pulmonary disease (COPD) is a long-term (chronic) lung problem. When you have COPD, it is hard for air to get in and out of your lungs. The way your lungs work will never return to normal. Usually the condition gets worse over time. There are things you can do to keep yourself as healthy as possible. Your doctor may treat your condition with:  Medicines.  Quitting smoking, if you smoke.  Rehabilitation. This may involve a team of specialists.  Oxygen.  Exercise and changes to your diet.  Lung surgery.  Comfort measures (palliative care).  Follow these instructions at home: Medicines  Take over-the-counter and prescription medicines only as told by your doctor.  Talk to your doctor before taking any cough or allergy medicines. You may need to avoid medicines that cause your lungs to be dry. Lifestyle  If you smoke, stop. Smoking makes the problem worse. If you need help quitting, ask your doctor.  Avoid being around things that make your breathing worse. This may include smoke, chemicals, and fumes.  Stay active, but remember to also rest.  Learn and use tips on how to relax.  Make sure you get enough sleep. Most adults need at least 7 hours a night.  Eat healthy foods. Eat smaller meals more often. Rest  before meals. Controlled breathing  Learn and use tips on how to control your breathing as told by your doctor. Try: ? Breathing in (inhaling) through your nose for 1 second. Then, pucker your lips and breath out (exhale) through your lips for 2 seconds. ? Putting one hand on your belly (abdomen). Breathe in slowly through your nose for 1 second. Your hand on your belly should move out. Pucker your lips and breathe out slowly through your lips. Your hand on your belly should move in as you breathe out. Controlled coughing  Learn and use controlled coughing to clear mucus from your lungs. The steps are: 1. Lean your head a little forward. 2. Breathe in deeply. 3. Try to hold your breath for 3 seconds. 4. Keep your mouth slightly open while coughing 2 times. 5. Spit any mucus out into a tissue. 6. Rest and do the steps again 1 or 2 times as needed. General instructions  Make sure you get all the shots (vaccines) that your doctor recommends. Ask your doctor about a flu shot and a pneumonia shot.  Use oxygen therapy and therapy to help improve your lungs (pulmonary rehabilitation) if told by your doctor. If you need home oxygen therapy, ask your doctor if you should buy a tool to measure your oxygen level (oximeter).  Make a COPD action plan with your doctor. This helps you know what to do if you feel worse than usual.  Manage any other conditions  you have as told by your doctor.  Avoid going outside when it is very hot, cold, or humid.  Avoid people who have a sickness you can catch (contagious).  Keep all follow-up visits as told by your doctor. This is important. Contact a doctor if:  You cough up more mucus than usual.  There is a change in the color or thickness of the mucus.  It is harder to breathe than usual.  Your breathing is faster than usual.  You have trouble sleeping.  You need to use your medicines more often than usual.  You have trouble doing your normal  activities such as getting dressed or walking around the house. Get help right away if:  You have shortness of breath while resting.  You have shortness of breath that stops you from: ? Being able to talk. ? Doing normal activities.  Your chest hurts for longer than 5 minutes.  Your skin color is more blue than usual.  Your pulse oximeter shows that you have low oxygen for longer than 5 minutes.  You have a fever.  You feel too tired to breathe normally. Summary  Chronic obstructive pulmonary disease (COPD) is a long-term lung problem.  The way your lungs work will never return to normal. Usually the condition gets worse over time. There are things you can do to keep yourself as healthy as possible.  Take over-the-counter and prescription medicines only as told by your doctor.  If you smoke, stop. Smoking makes the problem worse. This information is not intended to replace advice given to you by your health care provider. Make sure you discuss any questions you have with your health care provider. Document Released: 02/05/2008 Document Revised: 01/25/2016 Document Reviewed: 04/15/2013 Elsevier Interactive Patient Education  2017 [employer].

## 2017-10-01 ENCOUNTER — Telehealth: Payer: Self-pay | Admitting: Medical

## 2017-10-01 ENCOUNTER — Other Ambulatory Visit: Payer: Self-pay | Admitting: Medical

## 2017-10-01 MED ORDER — GLYCOPYRROLATE-FORMOTEROL 9-4.8 MCG/ACT IN AERO: 2.0000 | INHALATION_SPRAY | Freq: Two times a day (BID) | RESPIRATORY_TRACT | 2 refills | Status: DC

## 2017-10-01 NOTE — Telephone Encounter (Signed)
I sent a preventative inhaler called Bevespi to replace Anoro.  Lets see if this is more affordable.   Its a similar medication to Anoro.

## 2017-10-01 NOTE — Telephone Encounter (Signed)
Pt's spouse, Mel Almond, called stating that the Anoro Ellipta inhaler cost [phone] after insurance and Audelia Acton said for pt to let him know if the med was too expensive so he could send something different.

## 2017-10-02 NOTE — Telephone Encounter (Signed)
Called and notified pt

## 2017-10-13 ENCOUNTER — Other Ambulatory Visit: Payer: Self-pay | Admitting: Medical

## 2017-10-13 ENCOUNTER — Telehealth: Payer: Self-pay | Admitting: Family Medicine

## 2017-10-13 MED ORDER — VARENICLINE TARTRATE 0.5 MG X 11 & 1 MG X 42 PO MISC: ORAL | 0 refills | Status: DC

## 2017-10-13 NOTE — Telephone Encounter (Signed)
Pt wife is calling and states pt wants to try Chantix again to the [facility] on [address].  Discount card up front for pt.

## 2017-10-13 NOTE — Telephone Encounter (Signed)
pls call out Chantix

## 2017-10-14 ENCOUNTER — Other Ambulatory Visit: Payer: Self-pay

## 2017-10-14 ENCOUNTER — Telehealth: Payer: Self-pay | Admitting: Medical

## 2017-10-14 MED ORDER — VARENICLINE TARTRATE 0.5 MG X 11 & 1 MG X 42 PO MISC: ORAL | 0 refills | Status: DC

## 2017-10-14 NOTE — Telephone Encounter (Signed)
Pt wife, Mel Almond, Chantix has not been called in to the pharmacy yet. Called pharmacy and they verified this. Can pt have starter pak of Chantix again?

## 2017-10-14 NOTE — Telephone Encounter (Signed)
Done

## 2017-10-14 NOTE — Telephone Encounter (Signed)
Rcvd refill request for Sildenafil 100 mg #30

## 2017-10-14 NOTE — Telephone Encounter (Signed)
Med chantix was called into the [facility] on [address] and pt was informed by voice mail  that he could pick up a savings card here at the office.Thanks [employer]

## 2017-10-15 NOTE — Telephone Encounter (Signed)
That is fine, refill

## 2017-10-16 ENCOUNTER — Other Ambulatory Visit: Payer: Self-pay

## 2017-10-16 MED ORDER — SILDENAFIL CITRATE 100 MG PO TABS: ORAL_TABLET | ORAL | 0 refills | Status: DC

## 2017-10-16 NOTE — Telephone Encounter (Signed)
Sent refill to pharmacy. 

## 2017-10-19 ENCOUNTER — Telehealth: Payer: Self-pay | Admitting: Medical

## 2017-10-19 NOTE — Telephone Encounter (Signed)
P.A. SILDENAFIL  

## 2017-11-01 NOTE — Telephone Encounter (Signed)
P.A. Approved, pt informed  °

## 2017-11-14 ENCOUNTER — Other Ambulatory Visit (INDEPENDENT_AMBULATORY_CARE_PROVIDER_SITE_OTHER): Payer: Commercial Managed Care - PPO

## 2017-11-14 DIAGNOSIS — Z23 Encounter for immunization: Secondary | ICD-10-CM | POA: Diagnosis not present

## 2017-11-28 ENCOUNTER — Ambulatory Visit (INDEPENDENT_AMBULATORY_CARE_PROVIDER_SITE_OTHER): Payer: Commercial Managed Care - PPO | Admitting: Medical

## 2017-11-28 ENCOUNTER — Encounter: Payer: Self-pay | Admitting: Medical

## 2017-11-28 VITALS — BP 120/72 | HR 64 | Temp 98.1°F | Ht 74.0 in | Wt 208.6 lb

## 2017-11-28 DIAGNOSIS — J029 Acute pharyngitis, unspecified: Secondary | ICD-10-CM | POA: Diagnosis not present

## 2017-11-28 DIAGNOSIS — R490 Dysphonia: Secondary | ICD-10-CM | POA: Diagnosis not present

## 2017-11-28 MED ORDER — PREDNISONE 10 MG PO TABS: ORAL_TABLET | ORAL | 0 refills | Status: DC

## 2017-11-28 NOTE — Patient Instructions (Signed)

## 2017-11-28 NOTE — Progress Notes (Signed)
Subjective:  Calvin Reid is a 52 y.o. male who presents for  Chief Complaint  Patient presents with  . Sore Throat    1 week ago, lost voice, some coughing   Symptoms include 1-2 week hx/o sore throat, hoarse voice, has lost voice, and throat pain not so bad now but still has no voice.  Has had occasional cough.  No ear pain, no wheezing, no SOB, no fever, no nausea, no vomiting.   No body aches or chills.   He has had some heartburn and reflux.  Using nothing or reflux but has been using lozenge, sore throat spray, OTC ibuprofen.   denies sick contacts.  Patient is not a smoker.  No other aggravating or relieving factors.  No other complaint.    Past Medical History:  Diagnosis Date  . Elevated LFTs 07/2016  . Former smoker    quit 04/2016  . H/O cardiovascular stress test 03/08/2009   stress echo normal.   Dr. Peter Martinique  . Hypertension   . Impaired fasting blood sugar   . Wears partial dentures     Current Outpatient Medications on File Prior to Visit  Medication Sig Dispense Refill  . albuterol (PROVENTIL HFA;VENTOLIN HFA) 108 (90 Base) MCG/ACT inhaler Inhale 2 puffs into the lungs every 6 (six) hours as needed for wheezing or shortness of breath. 1 Inhaler 0  . aspirin 81 MG EC tablet TAKE 1 TABLET (81 MG TOTAL) BY MOUTH DAILY. 90 tablet 2  . Glycopyrrolate-Formoterol (BEVESPI AEROSPHERE) 9-4.8 MCG/ACT AERO Inhale 2 puffs into the lungs 2 (two) times daily. 10.7 g 2  . losartan-hydrochlorothiazide (HYZAAR) 50-12.5 MG tablet Take 1 tablet by mouth daily. 90 tablet 3  . pravastatin (PRAVACHOL) 20 MG tablet Take 1 tablet (20 mg total) by mouth every evening. 90 tablet 3  . sildenafil (VIAGRA) 100 MG tablet 1/2-1 tablet po daily prn 30 tablet 0  . varenicline (CHANTIX STARTING MONTH PAK) 0.5 MG X 11 & 1 MG X 42 tablet Take one 0.5 mg tablet by mouth once daily for 3 days, then increase to one 0.5 mg tablet twice daily for 4 days, then increase to one 1 mg tablet twice daily. 53  tablet 0   Current Facility-Administered Medications on File Prior to Visit  Medication Dose Route Frequency Provider Last Rate Last Dose  . 0.9 %  sodium chloride infusion  500 mL Intravenous Continuous Ladene Artist, MD        ROS as in subjective   Objective: BP 120/72 (BP Location: Right Arm, Patient Position: Sitting, Cuff Size: Normal)   Pulse 64   Temp 98.1 F (36.7 C) (Oral)   Ht 6\' 2"  (1.88 m)   Wt 208 lb 9.6 oz (94.6 kg)   SpO2 97%   BMI 26.78 kg/m   General appearance: Alert, well developed, well nourished, no distress                             Skin: warm, no rash                           Head: no sinus tenderness,                            Eyes: conjunctiva normal, corneas clear  Ears: normal left tympanic membrane, normal right tympanic membrane, external ear canals normal                          Nose: septum midline, turbinates swollen, with erythema and clear discharge             Mouth/throat: MMM, tongue normal, mild pharyngeal erythema                           Neck: supple, no adenopathy, no thyromegaly, non tender                         Lungs: clear, no wheezes, no rales, no rhonchi        Assessment  Encounter Diagnoses  Name Primary?  . Sore throat Yes  . Viral pharyngitis   . Hoarseness       Plan: Discussed diagnosis of viral pharyngitis   Discussed usual time frame to see improvement. Discussed possible complications or symptoms that would prompt call back or recheck within the next few days.     Medications prescribed:  Prednisone 5 day sample pack of Dexilant for GERD  Specific home care recommendations discussed:  Can use salt water gargles, warm fluids, salt water gargles, lozenges  Can use 4-5 days of antihistamine for drainage  Drink extra fluids. Fluids help thin the mucus so your sinuses can drain more easily.   Patient was advised to call or return if worse or not improving in the next few  days.    Patient voiced understanding of diagnosis, recommendations, and treatment plan.

## 2018-05-08 ENCOUNTER — Telehealth: Payer: Self-pay

## 2018-05-08 ENCOUNTER — Other Ambulatory Visit: Payer: Self-pay | Admitting: Medical

## 2018-05-08 NOTE — Telephone Encounter (Signed)
Left message on voicemail for patient to call back to schedule CPE appointment. 

## 2018-06-04 ENCOUNTER — Other Ambulatory Visit: Payer: Self-pay | Admitting: Medical

## 2018-06-08 ENCOUNTER — Other Ambulatory Visit: Payer: Self-pay | Admitting: Medical

## 2018-06-08 DIAGNOSIS — I10 Essential (primary) hypertension: Secondary | ICD-10-CM

## 2018-06-21 ENCOUNTER — Other Ambulatory Visit: Payer: Self-pay | Admitting: Medical

## 2018-06-23 NOTE — Telephone Encounter (Signed)
Refill and schedule CPX

## 2018-07-18 ENCOUNTER — Other Ambulatory Visit: Payer: Self-pay

## 2018-07-18 ENCOUNTER — Emergency Department (HOSPITAL_BASED_OUTPATIENT_CLINIC_OR_DEPARTMENT_OTHER)
Admission: EM | Admit: 2018-07-18 | Discharge: 2018-07-18 | Disposition: A | Discharge status: Home / Self Care | Payer: Commercial Managed Care - PPO | Attending: Emergency Medicine | Admitting: Emergency Medicine

## 2018-07-18 ENCOUNTER — Encounter (HOSPITAL_BASED_OUTPATIENT_CLINIC_OR_DEPARTMENT_OTHER): Payer: Self-pay | Admitting: Emergency Medicine

## 2018-07-18 ENCOUNTER — Emergency Department (HOSPITAL_BASED_OUTPATIENT_CLINIC_OR_DEPARTMENT_OTHER): Payer: Commercial Managed Care - PPO

## 2018-07-18 DIAGNOSIS — Z87891 Personal history of nicotine dependence: Secondary | ICD-10-CM | POA: Insufficient documentation

## 2018-07-18 DIAGNOSIS — S3992XA Unspecified injury of lower back, initial encounter: Secondary | ICD-10-CM | POA: Insufficient documentation

## 2018-07-18 DIAGNOSIS — I10 Essential (primary) hypertension: Secondary | ICD-10-CM | POA: Insufficient documentation

## 2018-07-18 DIAGNOSIS — M549 Dorsalgia, unspecified: Secondary | ICD-10-CM | POA: Diagnosis not present

## 2018-07-18 DIAGNOSIS — M5441 Lumbago with sciatica, right side: Secondary | ICD-10-CM | POA: Diagnosis not present

## 2018-07-18 DIAGNOSIS — Y998 Other external cause status: Secondary | ICD-10-CM | POA: Diagnosis not present

## 2018-07-18 DIAGNOSIS — Z79899 Other long term (current) drug therapy: Secondary | ICD-10-CM | POA: Insufficient documentation

## 2018-07-18 DIAGNOSIS — Y93I9 Activity, other involving external motion: Secondary | ICD-10-CM | POA: Insufficient documentation

## 2018-07-18 DIAGNOSIS — E785 Hyperlipidemia, unspecified: Secondary | ICD-10-CM | POA: Diagnosis not present

## 2018-07-18 DIAGNOSIS — S299XXA Unspecified injury of thorax, initial encounter: Secondary | ICD-10-CM | POA: Diagnosis not present

## 2018-07-18 DIAGNOSIS — M5442 Lumbago with sciatica, left side: Secondary | ICD-10-CM | POA: Diagnosis not present

## 2018-07-18 DIAGNOSIS — M5440 Lumbago with sciatica, unspecified side: Secondary | ICD-10-CM

## 2018-07-18 DIAGNOSIS — Y92410 Unspecified street and highway as the place of occurrence of the external cause: Secondary | ICD-10-CM | POA: Insufficient documentation

## 2018-07-18 MED ORDER — METHOCARBAMOL 500 MG PO TABS: 500.0000 mg | ORAL_TABLET | Freq: Two times a day (BID) | ORAL | 0 refills | Status: AC

## 2018-07-18 MED ORDER — ACETAMINOPHEN 325 MG PO TABS
650.0000 mg | ORAL_TABLET | Freq: Once | ORAL | Status: AC
  Administered 2018-07-18: 650 mg via ORAL
  Filled 2018-07-18: qty 2

## 2018-07-18 MED ORDER — NAPROXEN 500 MG PO TABS: 500.0000 mg | ORAL_TABLET | Freq: Two times a day (BID) | ORAL | 0 refills | Status: AC

## 2018-07-18 NOTE — Discharge Instructions (Signed)
I have prescribed muscle relaxers for your pain, please do not drink or drive while taking this medications as they can make you drowsy.  Please follow-up with PCP in 1 week for reevaluation of your symptoms.  You experience any bowel or bladder incontinence, fever, worsening in your symptoms please return to the ED. ° °

## 2018-07-18 NOTE — ED Notes (Signed)
Pt in radiology 
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
## 2018-07-20 ENCOUNTER — Other Ambulatory Visit: Payer: Self-pay | Admitting: Medical

## 2018-07-20 NOTE — Telephone Encounter (Signed)
Is this ok to refill?  

## 2018-07-20 NOTE — Telephone Encounter (Signed)
Needs physical appt.  Decline med refill

## 2018-07-24 ENCOUNTER — Ambulatory Visit (INDEPENDENT_AMBULATORY_CARE_PROVIDER_SITE_OTHER): Payer: Commercial Managed Care - PPO | Admitting: Medical

## 2018-07-24 ENCOUNTER — Encounter: Payer: Self-pay | Admitting: Medical

## 2018-07-24 VITALS — BP 126/80 | HR 59 | Temp 97.7°F | Ht 73.0 in | Wt 203.6 lb

## 2018-07-24 DIAGNOSIS — R945 Abnormal results of liver function studies: Secondary | ICD-10-CM

## 2018-07-24 DIAGNOSIS — N529 Male erectile dysfunction, unspecified: Secondary | ICD-10-CM

## 2018-07-24 DIAGNOSIS — R7301 Impaired fasting glucose: Secondary | ICD-10-CM | POA: Diagnosis not present

## 2018-07-24 DIAGNOSIS — I10 Essential (primary) hypertension: Secondary | ICD-10-CM

## 2018-07-24 DIAGNOSIS — E785 Hyperlipidemia, unspecified: Secondary | ICD-10-CM

## 2018-07-24 DIAGNOSIS — I517 Cardiomegaly: Secondary | ICD-10-CM

## 2018-07-24 DIAGNOSIS — Z8249 Family history of ischemic heart disease and other diseases of the circulatory system: Secondary | ICD-10-CM

## 2018-07-24 DIAGNOSIS — Z23 Encounter for immunization: Secondary | ICD-10-CM

## 2018-07-24 DIAGNOSIS — Z87891 Personal history of nicotine dependence: Secondary | ICD-10-CM

## 2018-07-24 DIAGNOSIS — R7989 Other specified abnormal findings of blood chemistry: Secondary | ICD-10-CM

## 2018-07-24 DIAGNOSIS — Z0000 Encounter for general adult medical examination without abnormal findings: Secondary | ICD-10-CM | POA: Diagnosis not present

## 2018-07-24 LAB — POCT URINALYSIS DIP (PROADVANTAGE DEVICE)
Bilirubin, UA: NEGATIVE
GLUCOSE UA: NEGATIVE mg/dL
Ketones, POC UA: NEGATIVE mg/dL
Leukocytes, UA: NEGATIVE
NITRITE UA: NEGATIVE
Protein Ur, POC: NEGATIVE mg/dL
RBC UA: NEGATIVE
Specific Gravity, Urine: 1.03
UUROB: 3.5
pH, UA: 5.5 (ref 5.0–8.0)

## 2018-07-24 MED ORDER — SILDENAFIL CITRATE 100 MG PO TABS: ORAL_TABLET | ORAL | 5 refills | Status: DC

## 2018-07-24 MED ORDER — ALBUTEROL SULFATE HFA 108 (90 BASE) MCG/ACT IN AERS: INHALATION_SPRAY | RESPIRATORY_TRACT | 1 refills | Status: DC

## 2018-07-24 MED ORDER — GLYCOPYRROLATE-FORMOTEROL 9-4.8 MCG/ACT IN AERO: 2.0000 | INHALATION_SPRAY | Freq: Two times a day (BID) | RESPIRATORY_TRACT | 11 refills | Status: DC

## 2018-07-24 MED ORDER — ASPIRIN 81 MG PO TBEC: 81.0000 mg | DELAYED_RELEASE_TABLET | Freq: Every day | ORAL | 3 refills | Status: DC

## 2018-07-24 MED ORDER — LOSARTAN POTASSIUM-HCTZ 50-12.5 MG PO TABS: 1.0000 | ORAL_TABLET | Freq: Every day | ORAL | 3 refills | Status: DC

## 2018-07-24 NOTE — Patient Instructions (Signed)
Health Maintenance, Male A healthy lifestyle and preventive care is important for your health and wellness. Ask your health care provider about what schedule of regular examinations is right for you. What should I know about weight and diet? Eat a Healthy Diet  Eat plenty of vegetables, fruits, whole grains, low-fat dairy products, and lean protein.  Do not eat a lot of foods high in solid fats, added sugars, or salt.  Maintain a Healthy Weight Regular exercise can help you achieve or maintain a healthy weight. You should:  Do at least 150 minutes of exercise each week. The exercise should increase your heart rate and make you sweat (moderate-intensity exercise).  Do strength-training exercises at least twice a week.  Watch Your Levels of Cholesterol and Blood Lipids  Have your blood tested for lipids and cholesterol every 5 years starting at 54 years of age. If you are at high risk for heart disease, you should start having your blood tested when you are 54 years old. You may need to have your cholesterol levels checked more often if: ? Your lipid or cholesterol levels are high. ? You are older than 54 years of age. ? You are at high risk for heart disease.  What should I know about cancer screening? Many types of cancers can be detected early and may often be prevented. Lung Cancer  You should be screened every year for lung cancer if: ? You are a current smoker who has smoked for at least 30 years. ? You are a former smoker who has quit within the past 15 years.  Talk to your health care provider about your screening options, when you should start screening, and how often you should be screened.  Colorectal Cancer  Routine colorectal cancer screening usually begins at 54 years of age and should be repeated every 5-10 years until you are 54 years old. You may need to be screened more often if early forms of precancerous polyps or small growths are found. Your health care  provider may recommend screening at an earlier age if you have risk factors for colon cancer.  Your health care provider may recommend using home test kits to check for hidden blood in the stool.  A small camera at the end of a tube can be used to examine your colon (sigmoidoscopy or colonoscopy). This checks for the earliest forms of colorectal cancer.  Prostate and Testicular Cancer  Depending on your age and overall health, your health care provider may do certain tests to screen for prostate and testicular cancer.  Talk to your health care provider about any symptoms or concerns you have about testicular or prostate cancer.  Skin Cancer  Check your skin from head to toe regularly.  Tell your health care provider about any new moles or changes in moles, especially if: ? There is a change in a mole's size, shape, or color. ? You have a mole that is larger than a pencil eraser.  Always use sunscreen. Apply sunscreen liberally and repeat throughout the day.  Protect yourself by wearing long sleeves, pants, a wide-brimmed hat, and sunglasses when outside.  What should I know about heart disease, diabetes, and high blood pressure?  If you are 44-22 years of age, have your blood pressure checked every 3-5 years. If you are 63 years of age or older, have your blood pressure checked every year. You should have your blood pressure measured twice-once when you are at a hospital or clinic, and once  when you are not at a hospital or clinic. Record the average of the two measurements. To check your blood pressure when you are not at a hospital or clinic, you can use: ? An automated blood pressure machine at a pharmacy. ? A home blood pressure monitor.  Talk to your health care provider about your target blood pressure.  If you are between 16-73 years old, ask your health care provider if you should take aspirin to prevent heart disease.  Have regular diabetes screenings by checking your  fasting blood sugar level. ? If you are at a normal weight and have a low risk for diabetes, have this test once every three years after the age of 79. ? If you are overweight and have a high risk for diabetes, consider being tested at a younger age or more often.  A one-time screening for abdominal aortic aneurysm (AAA) by ultrasound is recommended for men aged 35-75 years who are current or former smokers. What should I know about preventing infection? Hepatitis B If you have a higher risk for hepatitis B, you should be screened for this virus. Talk with your health care provider to find out if you are at risk for hepatitis B infection. Hepatitis C Blood testing is recommended for:  Everyone born from [birth year] through 1967.  Anyone with known risk factors for hepatitis C.  Sexually Transmitted Diseases (STDs)  You should be screened each year for STDs including gonorrhea and chlamydia if: ? You are sexually active and are younger than 54 years of age. ? You are older than 54 years of age and your health care provider tells you that you are at risk for this type of infection. ? Your sexual activity has changed since you were last screened and you are at an increased risk for chlamydia or gonorrhea. Ask your health care provider if you are at risk.  Talk with your health care provider about whether you are at high risk of being infected with HIV. Your health care provider may recommend a prescription medicine to help prevent HIV infection.  What else can I do?  Schedule regular health, dental, and eye exams.  Stay current with your vaccines (immunizations).  Do not use any tobacco products, such as cigarettes, chewing tobacco, and e-cigarettes. If you need help quitting, ask your health care provider.  Limit alcohol intake to no more than 2 drinks per day. One drink equals 12 ounces of beer, 5 ounces of wine, or 1 ounces of hard liquor.  Do not use street drugs.  Do not share  needles.  Ask your health care provider for help if you need support or information about quitting drugs.  Tell your health care provider if you often feel depressed.  Tell your health care provider if you have ever been abused or do not feel safe at home. This information is not intended to replace advice given to you by your health care provider. Make sure you discuss any questions you have with your health care provider. Document Released: 02/15/2008 Document Revised: 04/17/2016 Document Reviewed: 05/23/2015 Elsevier Interactive Patient Education  2018 [employer].  Fat and Cholesterol Restricted Diet High levels of fat and cholesterol in your blood may lead to various health problems, such as diseases of the heart, blood vessels, gallbladder, liver, and pancreas. Fats are concentrated sources of energy that come in various forms. Certain types of fat, including saturated fat, may be harmful in excess. Cholesterol is a substance needed by your body  in small amounts. Your body makes all the cholesterol it needs. Excess cholesterol comes from the food you eat. When you have high levels of cholesterol and saturated fat in your blood, health problems can develop because the excess fat and cholesterol will gather along the walls of your blood vessels, causing them to narrow. Choosing the right foods will help you control your intake of fat and cholesterol. This will help keep the levels of these substances in your blood within normal limits and reduce your risk of disease. What is my plan? Your health care provider recommends that you:  Limit your fat intake to ______% or less of your total calories per day.  Limit the amount of cholesterol in your diet to less than _________mg per day.  Eat 20-30 grams of fiber each day.  What types of fat should I choose?  Choose healthy fats more often. Choose monounsaturated and polyunsaturated fats, such as olive and canola oil, flaxseeds, walnuts,  almonds, and seeds.  Eat more omega-3 fats. Good choices include salmon, mackerel, sardines, tuna, flaxseed oil, and ground flaxseeds. Aim to eat fish at least two times a week.  Limit saturated fats. Saturated fats are primarily found in animal products, such as meats, butter, and cream. Plant sources of saturated fats include palm oil, palm kernel oil, and coconut oil.  Avoid foods with partially hydrogenated oils in them. These contain trans fats. Examples of foods that contain trans fats are stick margarine, some tub margarines, cookies, crackers, and other baked goods. What general guidelines do I need to follow? These guidelines for healthy eating will help you control your intake of fat and cholesterol:  Check food labels carefully to identify foods with trans fats or high amounts of saturated fat.  Fill one half of your plate with vegetables and green salads.  Fill one fourth of your plate with whole grains. Look for the word "whole" as the first word in the ingredient list.  Fill one fourth of your plate with lean protein foods.  Limit fruit to two servings a day. Choose fruit instead of juice.  Eat more foods that contain fiber, such as apples, broccoli, carrots, beans, peas, and barley.  Eat more home-cooked food and less restaurant, buffet, and fast food.  Limit or avoid alcohol.  Limit foods high in starch and sugar.  Limit fried foods.  Cook foods using methods other than frying. Baking, boiling, grilling, and broiling are all great options.  Lose weight if you are overweight. Losing just 5-10% of your initial body weight can help your overall health and prevent diseases such as diabetes and heart disease.  What foods can I eat? Grains  Whole grains, such as whole wheat or whole grain breads, crackers, cereals, and pasta. Unsweetened oatmeal, bulgur, barley, quinoa, or brown rice. Corn or whole wheat flour tortillas. Vegetables  Fresh or frozen vegetables (raw,  steamed, roasted, or grilled). Green salads. Fruits  All fresh, canned (in natural juice), or frozen fruits. Meats and other protein foods  Ground beef (85% or leaner), grass-fed beef, or beef trimmed of fat. Skinless chicken or [place]. Ground chicken or [place]. Pork trimmed of fat. All fish and seafood. Eggs. Dried beans, peas, or lentils. Unsalted nuts or seeds. Unsalted canned or dry beans. Dairy  Low-fat dairy products, such as skim or 1% milk, 2% or reduced-fat cheeses, low-fat ricotta or cottage cheese, or plain low-fat yo Fats and oils  Tub margarines without trans fats. Light or reduced-fat mayonnaise and salad  dressings. Avocado. Olive, canola, sesame, or safflower oils. Natural peanut or almond butter (choose ones without added sugar and oil). The items listed above may not be a complete list of recommended foods or beverages. Contact your dietitian for more options. Foods to avoid Grains  White bread. White pasta. White rice. Cornbread. Bagels, pastries, and croissants. Crackers that contain trans fat. Vegetables  White potatoes. Corn. Creamed or fried vegetables. Vegetables in a cheese sauce. Fruits  Dried fruits. Canned fruit in light or heavy syrup. Fruit juice. Meats and other protein foods  Fatty cuts of meat. Ribs, chicken wings, bacon, sausage, bologna, salami, chitterlings, fatback, hot dogs, bratwurst, and packaged luncheon meats. Liver and organ meats. Dairy  Whole or 2% milk, cream, half-and-half, and cream cheese. Whole milk cheeses. Whole-fat or sweetened yogurt. Full-fat cheeses. Nondairy creamers and whipped toppings. Processed cheese, cheese spreads, or cheese curds. Beverages  Alcohol. Sweetened drinks (such as sodas, lemonade, and fruit drinks or punches). Fats and oils  Butter, stick margarine, lard, shortening, ghee, or bacon fat. Coconut, palm kernel, or palm oils. Sweets and desserts  Corn syrup, sugars, honey, and molasses. Candy. Jam and jelly.  Syrup. Sweetened cereals. Cookies, pies, cakes, donuts, muffins, and ice cream. The items listed above may not be a complete list of foods and beverages to avoid. Contact your dietitian for more information. This information is not intended to replace advice given to you by your health care provider. Make sure you discuss any questions you have with your health care provider. Document Released: 08/19/2005 Document Revised: 09/09/2014 Document Reviewed: 11/17/2013 Elsevier Interactive Patient Education  2018 [place] (AHA) Exercise Recommendation  Being physically active is important to prevent heart disease and stroke, the nation's No. 1and No. 5killers. To improve overall cardiovascular health, we suggest at least 150 minutes per week of moderate exercise or 75 minutes per week of vigorous exercise (or a combination of moderate and vigorous activity). Thirty minutes a day, five times a week is an easy goal to remember. You will also experience benefits even if you divide your time into two or three segments of 10 to 15 minutes per day.  For people who would benefit from lowering their blood pressure or cholesterol, we recommend 40 minutes of aerobic exercise of moderate to vigorous intensity three to four times a week to lower the risk for heart attack and stroke.  Physical activity is anything that makes you move your body and burn calories.  This includes things like climbing stairs or playing sports. Aerobic exercises benefit your heart, and include walking, jogging, swimming or biking. Strength and stretching exercises are best for overall stamina and flexibility.  The simplest, positive change you can make to effectively improve your heart health is to start walking. It's enjoyable, free, easy, social and great exercise. A walking program is flexible and boasts high success rates because people can stick with it. It's easy for walking to become a regular and  satisfying part of life.   For Overall Cardiovascular Health:  At least 30 minutes of moderate-intensity aerobic activity at least 5 days per week for a total of 150  OR   At least 25 minutes of vigorous aerobic activity at least 3 days per week for a total of 75 minutes; or a combination of moderate- and vigorous-intensity aerobic activity  AND   Moderate- to high-intensity muscle-strengthening activity at least 2 days per week for additional health benefits.  For Lowering Blood Pressure and Cholesterol  An average 40 minutes of moderate- to vigorous-intensity aerobic activity 3 or 4 times per week  What if I can't make it to the time goal? Something is always better than nothing! And everyone has to start somewhere. Even if you've been sedentary for years, today is the day you can begin to make healthy changes in your life. If you don't think you'll make it for 30 or 40 minutes, set a reachable goal for today. You can work up toward your overall goal by increasing your time as you get stronger. Don't let all-or-nothing thinking rob you of doing what you can every day.  Source:http://www.heart.org

## 2018-07-24 NOTE — Progress Notes (Signed)
Subjective:   HPI  Calvin Reid is a 52 y.o. male who presents for a complete physical.    Medical team: Carlena Hurl, PA-C here for primary care Dr. Kennedy Bucker, GI Dr. Peter Martinique, cardiology Dentist, Dr. Thayer Ohm Eye doctor, vision works  Concerns: None  Quit smoking 7 months ago  [place] to [place] for vacation next week  Reviewed their medical, surgical, family, social, medication, and allergy history and updated chart as appropriate.  Past Medical History:  Diagnosis Date  . Elevated LFTs 07/2016  . Former smoker    quit 04/2016  . H/O cardiovascular stress test 03/08/2009   stress echo normal.   Dr. Peter Martinique  . Hypertension   . Impaired fasting blood sugar   . Wears partial dentures     Past Surgical History:  Procedure Laterality Date  . COLONOSCOPY  06/11/2016   repeat 2027.   Dr. Kennedy Bucker, colon polyps  . NO PAST SURGERIES  04/2016  . WISDOM TOOTH EXTRACTION      Social History   Socioeconomic History  . Marital status: Single    Spouse name: Not on file  . Number of children: 2  . Years of education: Not on file  . Highest education level: Not on file  Occupational History  . Occupation: Patent attorney  Social Needs  . Financial resource strain: Not on file  . Food insecurity:    Worry: Not on file    Inability: Not on file  . Transportation needs:    Medical: Not on file    Non-medical: Not on file  Tobacco Use  . Smoking status: Former Smoker    Packs/day: 0.50    Years: 25.00    Pack years: 12.50    Types: Cigarettes    Last attempt to quit: 04/21/2016    Years since quitting: 2.2  . Smokeless tobacco: Never Used  Substance and Sexual Activity  . Alcohol use: Yes    Alcohol/week: 4.0 standard drinks    Types: 4 Cans of beer per week  . Drug use: No  . Sexual activity: Yes  Lifestyle  . Physical activity:    Days per week: Not on file    Minutes per session: Not on file  . Stress: Not on file   Relationships  . Social connections:    Talks on phone: Not on file    Gets together: Not on file    Attends religious service: Not on file    Active member of club or organization: Not on file    Attends meetings of clubs or organizations: Not on file    Relationship status: Not on file  . Intimate partner violence:    Fear of current or ex partner: Not on file    Emotionally abused: Not on file    Physically abused: Not on file    Forced sexual activity: Not on file  Other Topics Concern  . Not on file  Social History Narrative   [place], exercise - not much.   3 sons.   Divorced.   As of 04/2016.    Family History  Problem Relation Age of Onset  . Hypertension Mother   . COPD Father        died in his 61s  . Heart disease Sister 40       CAD  . Heart disease Son        arrhythmia  . Heart disease Son   .  Diabetes Brother   . Stroke Neg Hx   . Cancer Neg Hx   . Colon cancer Neg Hx      Current Outpatient Medications:  .  albuterol (PROAIR HFA) 108 (90 Base) MCG/ACT inhaler, TAKE 2 PUFFS BY MOUTH EVERY 6 HOURS AS NEEDED FOR WHEEZE OR SHORTNESS OF BREATH, Disp: 18 g, Rfl: 1 .  aspirin 81 MG EC tablet, Take 1 tablet (81 mg total) by mouth daily., Disp: 90 tablet, Rfl: 3 .  Glycopyrrolate-Formoterol (BEVESPI AEROSPHERE) 9-4.8 MCG/ACT AERO, Inhale 2 puffs into the lungs 2 (two) times daily., Disp: 10.7 g, Rfl: 11 .  losartan-hydrochlorothiazide (HYZAAR) 50-12.5 MG tablet, Take 1 tablet by mouth daily., Disp: 90 tablet, Rfl: 3 .  methocarbamol (ROBAXIN) 500 MG tablet, Take 1 tablet (500 mg total) by mouth 2 (two) times daily for 7 days., Disp: 14 tablet, Rfl: 0 .  naproxen (NAPROSYN) 500 MG tablet, Take 1 tablet (500 mg total) by mouth 2 (two) times daily for 7 days., Disp: 14 tablet, Rfl: 0 .  pravastatin (PRAVACHOL) 20 MG tablet, Take 1 tablet (20 mg total) by mouth every evening., Disp: 90 tablet, Rfl: 3 .  sildenafil (VIAGRA) 100 MG tablet, 1/2-1 tablet po daily  prn, Disp: 30 tablet, Rfl: 5  Current Facility-Administered Medications:  .  0.9 %  sodium chloride infusion, 500 mL, Intravenous, Continuous, Ladene Artist, MD  No Known Allergies   Review of Systems Constitutional: -fever, -chills, -sweats, -unexpected weight change, -decreased appetite, -fatigue Allergy: -sneezing, -itching, -congestion Dermatology: -changing moles, --rash, -lumps ENT: -runny nose, -ear pain, -sore throat, -hoarseness, -sinus pain, -teeth pain, - ringing in ears, -hearing loss, -nosebleeds Cardiology: -chest pain, -palpitations, -swelling, -difficulty breathing when lying flat, -waking up short of breath Respiratory: -cough, -shortness of breath, -difficulty breathing with exercise or exertion, -wheezing, -coughing up blood Gastroenterology: -abdominal pain, -nausea, -vomiting, -diarrhea, -constipation, -blood in stool, -changes in bowel movement, -difficulty swallowing or eating Hematology: -bleeding, -bruising  Musculoskeletal: -joint aches, -muscle aches, -joint swelling, -back pain, -neck pain, -cramping, -changes in gait Ophthalmology: denies vision changes, eye redness, itching, discharge Urology: -nocturia,  -burning with urination, -difficulty urinating, -blood in urine, -urinary frequency, -urgency, -incontinence Neurology: -headache, -weakness, -tingling, -numbness, -memory loss, -falls, -dizziness Psychology: -depressed mood, -agitation, -sleep problems     Objective:   Physical Exam  BP 126/80 (BP Location: Right Arm, Patient Position: Sitting)   Pulse (!) 59   Temp 97.7 F (36.5 C)   Ht 6\' 1"  (1.854 m)   Wt 203 lb 9.6 oz (92.4 kg)   SpO2 98%   BMI 26.86 kg/m   General appearance: alert, no distress, WD/WN, AA male Skin:   Few scattered macules.  No worrisome lesions HEENT: normocephalic, conjunctiva/corneas normal, sclerae anicteric, PERRLA, EOMi, nares patent, no discharge or erythema, pharynx normal Oral cavity: MMM, tongue normal, teeth -  upper denture Neck: supple, no lymphadenopathy, no thyromegaly, no masses, normal ROM, no bruits Chest: upper center of chest with horizontal keloid scar, approx 3cm long, non tender, normal shape and expansion Heart: RRR, normal S1, S2, no murmurs Lungs: CTA bilaterally, no wheezes, rhonchi, or rales Abdomen: +bs, soft, non tender, non distended, no masses, no hepatomegaly, no splenomegaly, no bruits Back: non tender, normal ROM, no scoliosis Musculoskeletal: upper extremities non tender, no obvious deformity, normal ROM throughout, lower extremities non tender, no obvious deformity, normal ROM throughout Extremities: no edema, no cyanosis, no clubbing Pulses: 2+ symmetric, upper and lower extremities, normal cap refill Neurological: alert, oriented  x 3, CN2-12 intact, strength normal upper extremities and lower extremities, sensation normal throughout, DTRs 2+ throughout, no cerebellar signs, gait normal Psychiatric: normal affect, behavior normal, pleasant  GU: normal male external genitalia, uncirc, nontender, no masses, no hernia, no lymphadenopathy Rectal: anus normal tone, prostate WNL   Assessment and Plan :    Encounter Diagnoses  Name Primary?  . Encounter for health maintenance examination in adult Yes  . Essential hypertension   . LVH (left ventricular hypertrophy)   . Impaired fasting blood sugar   . Erectile dysfunction, unspecified erectile dysfunction type   . Family history of premature CAD   . Former smoker   . Hyperlipidemia, unspecified hyperlipidemia type   . Elevated LFTs   . Need for immunization against influenza     Physical exam - discussed healthy lifestyle, diet, exercise, preventative care, vaccinations, and addressed their concerns.   See your eye doctor yearly for routine vision care. See your dentist yearly for routine dental care including hygiene visits twice yearly.  Vaccines You are up-to-date on the Shingrix shingles vaccine You are  up-to-date on tetanus vaccine You are up-to-date on pneumonia vaccine Counseled on the influenza virus vaccine.  Vaccine information sheet given.  Influenza vaccine given after consent obtained.  Cancer screening We will check a PSA prostate blood test today You are up-to-date on colon cancer screening We will check a blood count today as part of cancer screening  Heart disease screening Your risk factors include family history of premature heart disease, high blood pressure, high cholesterol You had an ultrasound of your heart back in 2017, and at that time he had mild enlargement of the heart, and cardiology felt no further evaluation needed at that time The goal is to continue to keep your blood pressure within normal range Limit your salt intake preferably to less than 2000 mg a day Eat a healthy low-fat diet, exercise regularly Limit Naprosyn or other anti-inflammatory such as ibuprofen, Aleve, Motrin, as they can impact her blood pressure as well   C/t current medicaitons Follow-up pending labs  Clyde was seen today for other.  Diagnoses and all orders for this visit:  Encounter for health maintenance examination in adult -     Flu Vaccine QUAD 36+ mos IM (Fluarix) -     Comprehensive metabolic panel -     Hemoglobin A1c -     PSA(Must document that pt has been informed of limitations of PSA testing.)  Essential hypertension -     losartan-hydrochlorothiazide (HYZAAR) 50-12.5 MG tablet; Take 1 tablet by mouth daily.  LVH (left ventricular hypertrophy)  Impaired fasting blood sugar  Erectile dysfunction, unspecified erectile dysfunction type  Family history of premature CAD  Former smoker  Hyperlipidemia, unspecified hyperlipidemia type  Elevated LFTs  Need for immunization against influenza -     Flu Vaccine QUAD 36+ mos IM (Fluarix)  Other orders -     sildenafil (VIAGRA) 100 MG tablet; 1/2-1 tablet po daily prn -     albuterol (PROAIR HFA) 108 (90 Base)  MCG/ACT inhaler; TAKE 2 PUFFS BY MOUTH EVERY 6 HOURS AS NEEDED FOR WHEEZE OR SHORTNESS OF BREATH -     Glycopyrrolate-Formoterol (BEVESPI AEROSPHERE) 9-4.8 MCG/ACT AERO; Inhale 2 puffs into the lungs 2 (two) times daily. -     aspirin 81 MG EC tablet; Take 1 tablet (81 mg total) by mouth daily.

## 2018-07-25 LAB — HEMOGLOBIN A1C
ESTIMATED AVERAGE GLUCOSE: 128 mg/dL
Hgb A1c MFr Bld: 6.1 % — ABNORMAL HIGH (ref 4.8–5.6)

## 2018-07-25 LAB — COMPREHENSIVE METABOLIC PANEL
ALT: 49 IU/L — ABNORMAL HIGH (ref 0–44)
AST: 43 IU/L — AB (ref 0–40)
Albumin/Globulin Ratio: 2.3 — ABNORMAL HIGH (ref 1.2–2.2)
Albumin: 5 g/dL (ref 3.5–5.5)
Alkaline Phosphatase: 82 IU/L (ref 39–117)
BUN/Creatinine Ratio: 18 (ref 9–20)
BUN: 19 mg/dL (ref 6–24)
Bilirubin Total: 0.4 mg/dL (ref 0.0–1.2)
CALCIUM: 10.4 mg/dL — AB (ref 8.7–10.2)
CO2: 22 mmol/L (ref 20–29)
CREATININE: 1.08 mg/dL (ref 0.76–1.27)
Chloride: 104 mmol/L (ref 96–106)
GFR calc Af Amer: 90 mL/min/{1.73_m2} (ref >59)
GFR, EST NON AFRICAN AMERICAN: 78 mL/min/{1.73_m2} (ref >59)
GLOBULIN, TOTAL: 2.2 g/dL (ref 1.5–4.5)
Glucose: 93 mg/dL (ref 65–99)
Potassium: 4 mmol/L (ref 3.5–5.2)
SODIUM: 142 mmol/L (ref 134–144)
Total Protein: 7.2 g/dL (ref 6.0–8.5)

## 2018-07-25 LAB — PSA: Prostate Specific Ag, Serum: 0.5 ng/mL (ref 0.0–4.0)

## 2018-08-03 ENCOUNTER — Other Ambulatory Visit: Payer: Self-pay | Admitting: Medical

## 2018-08-03 DIAGNOSIS — R945 Abnormal results of liver function studies: Principal | ICD-10-CM

## 2018-08-03 DIAGNOSIS — R7989 Other specified abnormal findings of blood chemistry: Secondary | ICD-10-CM

## 2018-08-18 ENCOUNTER — Other Ambulatory Visit: Payer: Commercial Managed Care - PPO

## 2018-08-18 DIAGNOSIS — R945 Abnormal results of liver function studies: Principal | ICD-10-CM

## 2018-08-18 DIAGNOSIS — R7989 Other specified abnormal findings of blood chemistry: Secondary | ICD-10-CM

## 2018-08-20 LAB — PROTEIN ELECTROPHORESIS, SERUM
A/G RATIO SPE: 1.4 (ref 0.7–1.7)
ALBUMIN ELP: 4 g/dL (ref 2.9–4.4)
Alpha 1: 0.2 g/dL (ref 0.0–0.4)
Alpha 2: 0.9 g/dL (ref 0.4–1.0)
BETA: 0.8 g/dL (ref 0.7–1.3)
Gamma Globulin: 0.9 g/dL (ref 0.4–1.8)
Globulin, Total: 2.8 g/dL (ref 2.2–3.9)
TOTAL PROTEIN: 6.8 g/dL (ref 6.0–8.5)

## 2018-08-23 ENCOUNTER — Other Ambulatory Visit: Payer: Self-pay | Admitting: Medical

## 2018-08-24 NOTE — Telephone Encounter (Signed)
Tried to call patient to see if they actually put in for this as you just sent 07/24/18 with 1 refill-they did not have a voicemail set up, sorry.

## 2018-08-31 ENCOUNTER — Encounter: Payer: Self-pay | Admitting: Internal Medicine

## 2018-09-03 ENCOUNTER — Other Ambulatory Visit: Payer: Commercial Managed Care - PPO

## 2018-09-23 ENCOUNTER — Ambulatory Visit
Admission: RE | Admit: 2018-09-23 | Discharge: 2018-09-23 | Disposition: A | Discharge status: Home / Self Care | Payer: Commercial Managed Care - PPO | Source: Ambulatory Visit | Attending: Medical | Admitting: Medical

## 2018-09-23 ENCOUNTER — Telehealth: Payer: Self-pay | Admitting: Medical

## 2018-09-23 DIAGNOSIS — R7989 Other specified abnormal findings of blood chemistry: Secondary | ICD-10-CM

## 2018-09-23 DIAGNOSIS — R945 Abnormal results of liver function studies: Principal | ICD-10-CM

## 2018-09-23 NOTE — Telephone Encounter (Signed)
Patient notified of ultrasound results ?

## 2018-09-23 NOTE — Telephone Encounter (Signed)
His liver ultrasound shows fatty liver deposits.  The treatment for this is healthy low-fat diet, limiting portion size particularly on fatty foods are high carbohydrate such as grains and rice and bread, losing weight if he has weight to lose, and getting regular exercise

## 2018-10-10 ENCOUNTER — Telehealth: Payer: Self-pay | Admitting: Medical

## 2018-10-10 NOTE — Telephone Encounter (Signed)
P.A. SILDENAFIL  

## 2018-11-09 NOTE — Telephone Encounter (Signed)
P.A. approved til 10/10/19

## 2018-11-16 ENCOUNTER — Telehealth: Payer: Self-pay

## 2018-11-16 NOTE — Telephone Encounter (Signed)
[facility] sent a fax inquiring about asthma care for the patient whom they noticed has multiple rescue inhalers filled without filling a controller medication for asthma in last 180 days. They are reaching out on behalf of the patient to see if it is appropriate to start a daily asthma controller therapy. Please advise.

## 2018-11-16 NOTE — Telephone Encounter (Signed)
Ok, then schedule for follow up on asthma

## 2018-11-17 NOTE — Telephone Encounter (Signed)
Due to being unable to reach patient, a letter was sent asking patient to call the office to schedule an appointment.

## 2018-12-07 ENCOUNTER — Telehealth: Payer: Self-pay | Admitting: Medical

## 2018-12-07 ENCOUNTER — Other Ambulatory Visit: Payer: Self-pay | Admitting: Medical

## 2018-12-07 MED ORDER — PROAIR HFA 108 (90 BASE) MCG/ACT IN AERS: INHALATION_SPRAY | RESPIRATORY_TRACT | 0 refills | Status: DC

## 2018-12-07 NOTE — Telephone Encounter (Signed)
Pt needs a refill on his proair HFA pt needs [facility] to [facility] to [facility] [place], [place] [address]  pt states he will call tomorrow and schedule a virtual office visit pt can be reached at [phone]

## 2018-12-07 NOTE — Telephone Encounter (Signed)
Left message with pts wife regarding albuterol inhaler RX

## 2019-01-26 ENCOUNTER — Telehealth: Payer: Self-pay | Admitting: Medical

## 2019-02-01 ENCOUNTER — Other Ambulatory Visit: Payer: Self-pay | Admitting: Medical

## 2019-02-01 NOTE — Telephone Encounter (Signed)
Is this ok to refill?  

## 2019-03-07 IMAGING — US US ABDOMEN COMPLETE
1 series · 14 of 25 positions shown · non-contrast
Comparison: None available

CLINICAL DATA: Elevated LFTs

EXAM:
COMPLETE ABDOMINAL ULTRASOUND

[Series 1: us abdomen complete · 0.25mm/px · 14 of 78 slices shown]
[im 1/78]
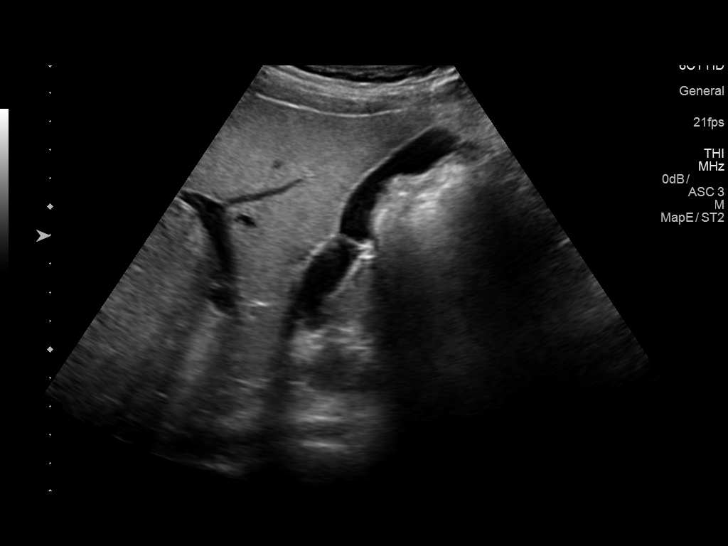
[im 7/78]
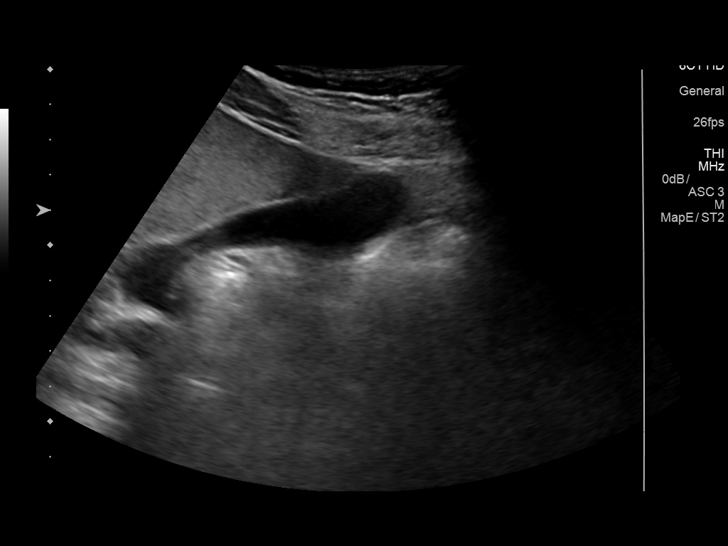
[im 13/78]
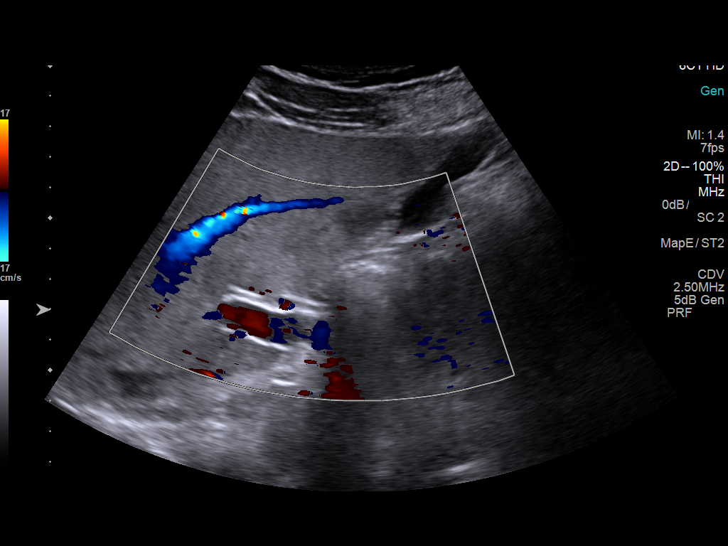
[im 20/78]
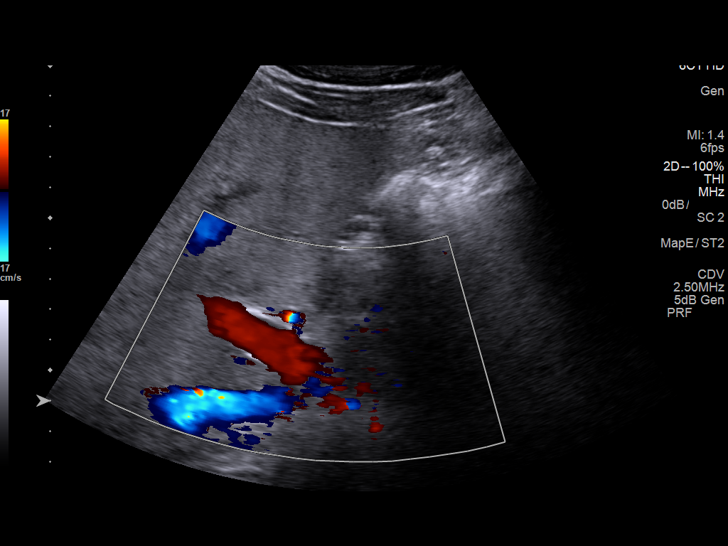
[im 26/78]
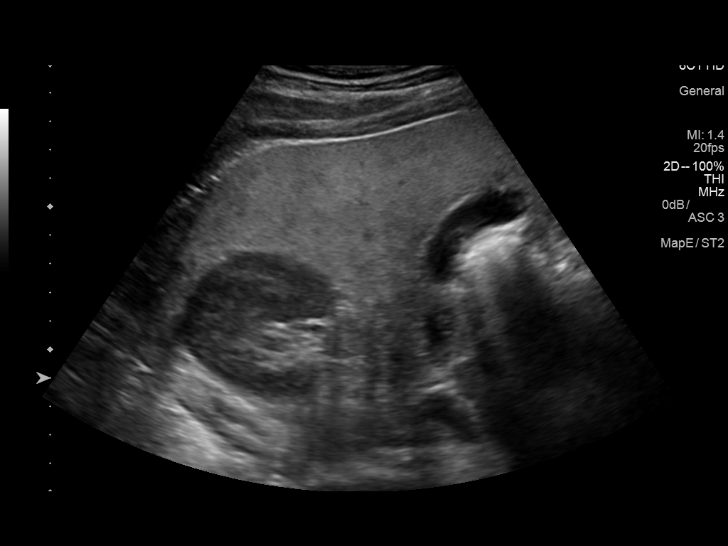
[im 29/78]
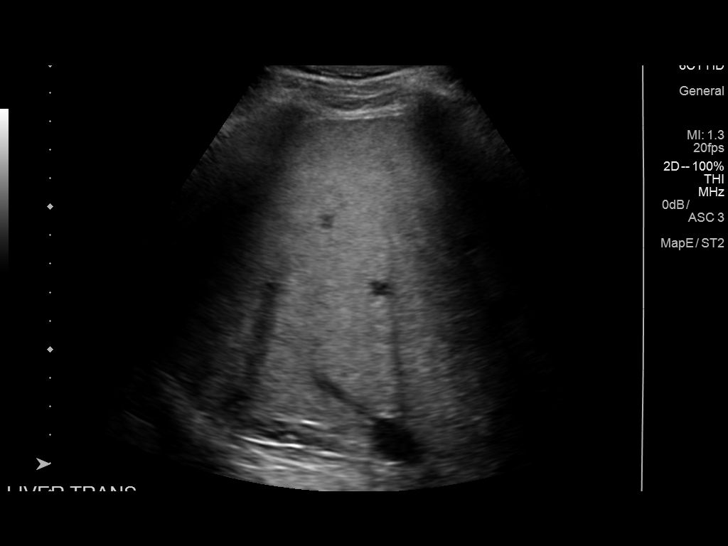
[im 36/78]
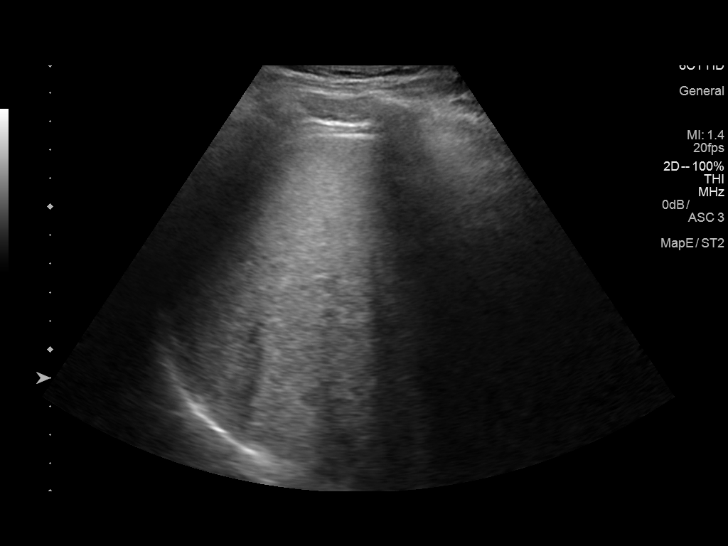
[im 42/78]
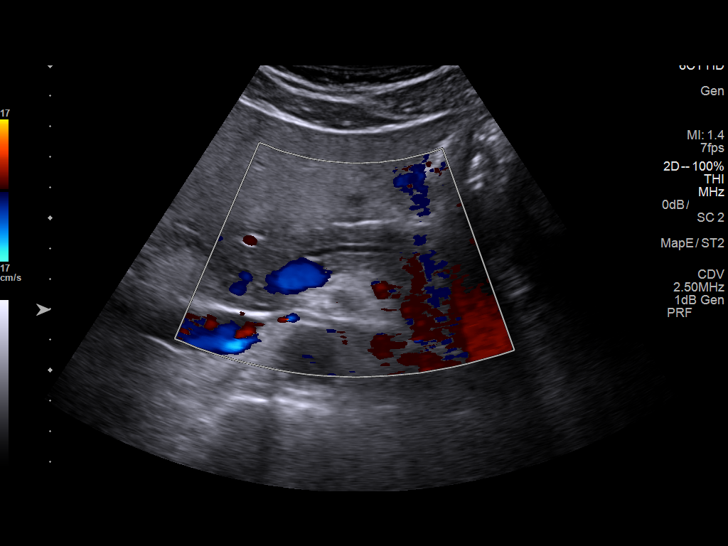
[im 49/78]
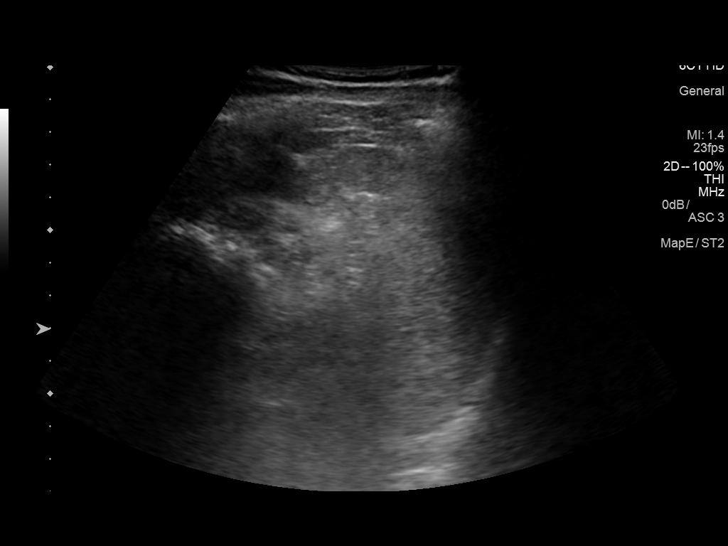
[im 52/78]
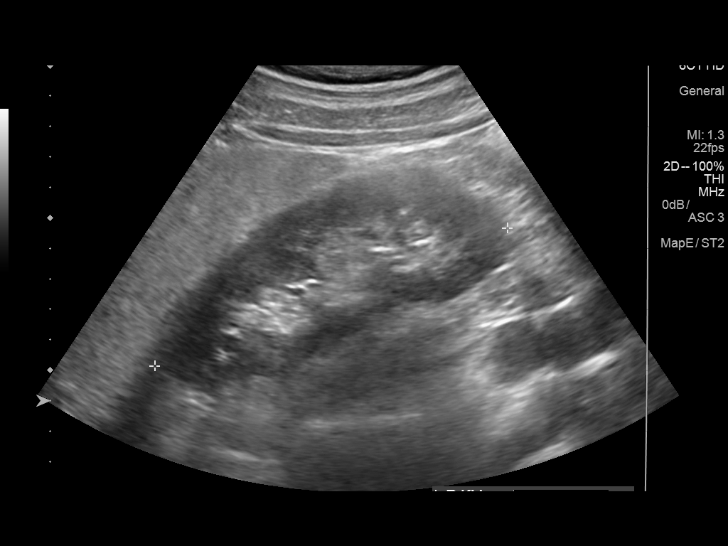
[im 58/78]
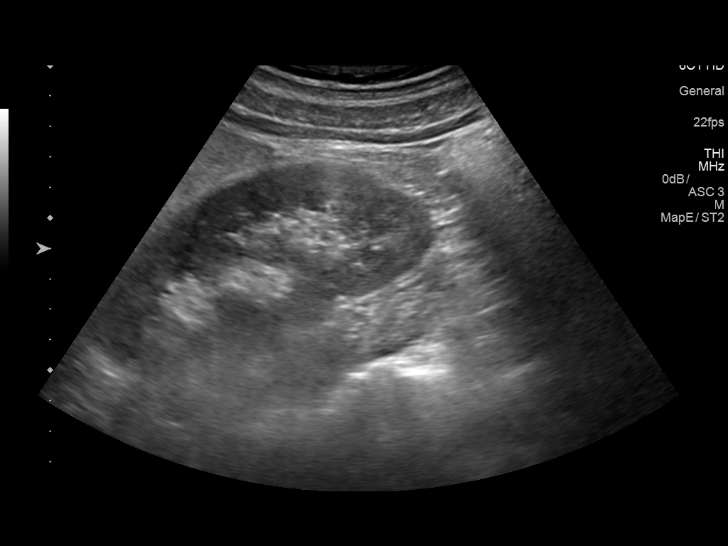
[im 65/78]
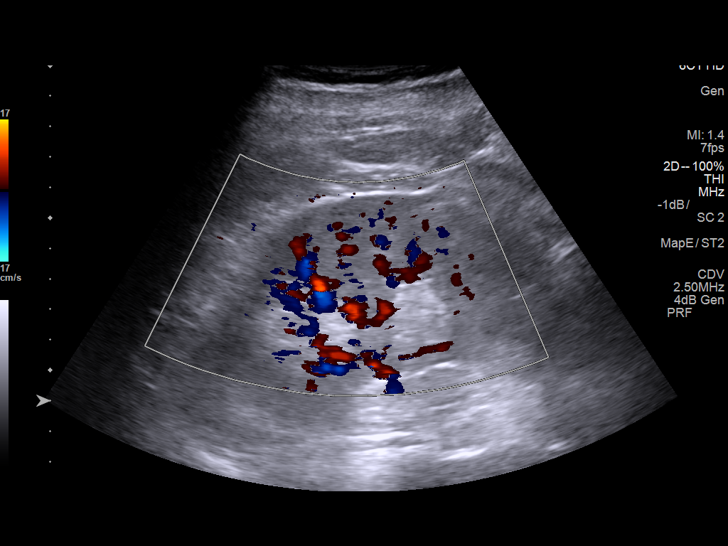
[im 71/78]
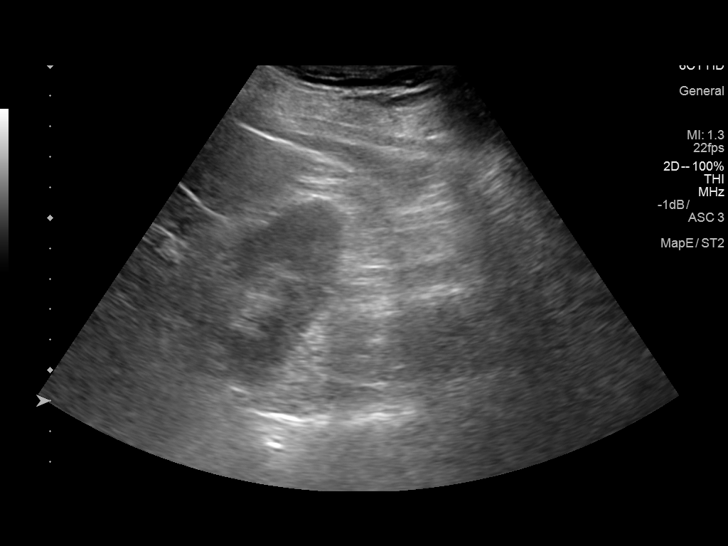
[im 78/78]
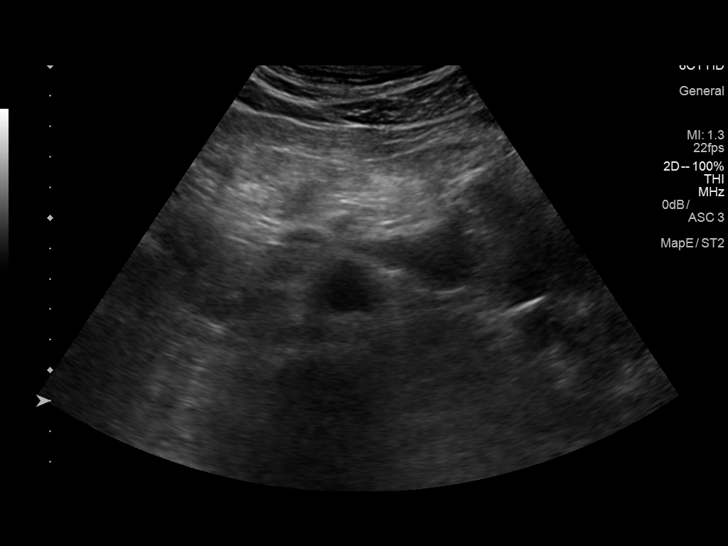

[14 of 25 positions shown; findings below may reference images not displayed]

FINDINGS: Gallbladder: Physiologically distended without stones, wall
thickening, or pericholecystic fluid. Sonographer reports no
sonographic Murphy's sign.

Common bile duct:  Normal in caliber, 2.2mm diameter.

Liver: Somewhat echogenic parenchyma without focal lesion. More
hypoechoic region adjacent to the gallbladder fossa characteristic
of focal fatty sparing. No biliary ductal dilatation. Antegrade
color flow signal in the main portal vein.

IVC:  Negative

Pancreas:  Negative

Spleen:  No focal lesion, craniocaudal 4.4cm in length.

Right Kidney:  No mass or hydronephrosis, 12.4cm in length.

Left Kidney:  No lesion or hydronephrosis, 12.4cm in length.

Abdominal aorta: Visualized segments unremarkable, portions obscured
by overlying bowel gas.
IMPRESSION: 1. Normal gallbladder.
2. Echogenic liver parenchyma, a nonspecific finding often
associated with fatty infiltration, with focal sparing near the
gallbladder fossa.

## 2019-05-18 ENCOUNTER — Other Ambulatory Visit: Payer: Self-pay | Admitting: Medical

## 2019-06-13 ENCOUNTER — Other Ambulatory Visit: Payer: Self-pay | Admitting: Medical

## 2019-06-14 NOTE — Telephone Encounter (Signed)
Schedule CPX fasting.  Refill inhaler without refill if needed

## 2019-06-15 NOTE — Telephone Encounter (Signed)
Patient stated he does not need a refill at this time

## 2019-06-22 ENCOUNTER — Other Ambulatory Visit: Payer: Self-pay

## 2019-06-22 NOTE — Telephone Encounter (Signed)
Request was sent to provider.

## 2019-06-22 NOTE — Telephone Encounter (Signed)
Received fax from [facility] [place] for a refill on [employer].

## 2019-07-26 ENCOUNTER — Encounter: Payer: Commercial Managed Care - PPO | Admitting: Medical

## 2019-08-04 ENCOUNTER — Encounter: Payer: Self-pay | Admitting: Medical

## 2019-08-04 ENCOUNTER — Other Ambulatory Visit: Payer: Self-pay

## 2019-08-04 ENCOUNTER — Ambulatory Visit (INDEPENDENT_AMBULATORY_CARE_PROVIDER_SITE_OTHER): Payer: Commercial Managed Care - PPO | Admitting: Medical

## 2019-08-04 VITALS — BP 134/88 | HR 68 | Temp 97.8°F | Ht 73.0 in | Wt 199.0 lb

## 2019-08-04 DIAGNOSIS — Z0000 Encounter for general adult medical examination without abnormal findings: Secondary | ICD-10-CM

## 2019-08-04 DIAGNOSIS — I10 Essential (primary) hypertension: Secondary | ICD-10-CM | POA: Diagnosis not present

## 2019-08-04 DIAGNOSIS — R7301 Impaired fasting glucose: Secondary | ICD-10-CM | POA: Diagnosis not present

## 2019-08-04 DIAGNOSIS — Z860100 Personal history of colon polyps, unspecified: Secondary | ICD-10-CM

## 2019-08-04 DIAGNOSIS — R0602 Shortness of breath: Secondary | ICD-10-CM

## 2019-08-04 DIAGNOSIS — Z136 Encounter for screening for cardiovascular disorders: Secondary | ICD-10-CM | POA: Diagnosis not present

## 2019-08-04 DIAGNOSIS — Z8249 Family history of ischemic heart disease and other diseases of the circulatory system: Secondary | ICD-10-CM | POA: Diagnosis not present

## 2019-08-04 DIAGNOSIS — Z7185 Encounter for immunization safety counseling: Secondary | ICD-10-CM

## 2019-08-04 DIAGNOSIS — R7989 Other specified abnormal findings of blood chemistry: Secondary | ICD-10-CM

## 2019-08-04 DIAGNOSIS — R05 Cough: Secondary | ICD-10-CM

## 2019-08-04 DIAGNOSIS — N529 Male erectile dysfunction, unspecified: Secondary | ICD-10-CM

## 2019-08-04 DIAGNOSIS — Z23 Encounter for immunization: Secondary | ICD-10-CM | POA: Diagnosis not present

## 2019-08-04 DIAGNOSIS — R059 Cough, unspecified: Secondary | ICD-10-CM

## 2019-08-04 DIAGNOSIS — Z87891 Personal history of nicotine dependence: Secondary | ICD-10-CM

## 2019-08-04 DIAGNOSIS — E785 Hyperlipidemia, unspecified: Secondary | ICD-10-CM

## 2019-08-04 DIAGNOSIS — Z7189 Other specified counseling: Secondary | ICD-10-CM

## 2019-08-04 DIAGNOSIS — R942 Abnormal results of pulmonary function studies: Secondary | ICD-10-CM

## 2019-08-04 MED ORDER — ASPIRIN 81 MG PO TBEC: 81.0000 mg | DELAYED_RELEASE_TABLET | Freq: Every day | ORAL | 3 refills | Status: DC

## 2019-08-04 MED ORDER — PROAIR HFA 108 (90 BASE) MCG/ACT IN AERS: 2.0000 | INHALATION_SPRAY | Freq: Four times a day (QID) | RESPIRATORY_TRACT | 1 refills | Status: DC | PRN

## 2019-08-04 MED ORDER — ANORO ELLIPTA 62.5-25 MCG/INH IN AEPB: 1.0000 | INHALATION_SPRAY | Freq: Every day | RESPIRATORY_TRACT | 1 refills | Status: DC

## 2019-08-04 MED ORDER — SILDENAFIL CITRATE 100 MG PO TABS: ORAL_TABLET | ORAL | 5 refills | Status: DC

## 2019-08-04 NOTE — Patient Instructions (Signed)
Recommendations: See your eye doctor and dentist yearly  You had abnormal liver test last year.  We will recheck these liver test today.  You also had abnormal calcium level last year.  We will follow up on this today.  We updated your flu shot today.  You are due for tetanus shot.  Return for this at your convenience.  Your main concern today is breathing and shortness of breath.  He has a history of enlarged heart on echocardiogram and your EKG is abnormal but not a lot different than 2017.  It may be a good I did update your echocardiogram since his been 3 years.  Consider follow-up with cardiology  The main finding today is your breathing test is abnormal suggesting COPD.  I recommend we begin a trial of a daily preventative inhaler.  This is meant to reduce inflammation in the lungs and help open up your lungs with a long-acting medication.  You can still use albuterol rescue inhaler 2 puffs every 6 hours as needed for wheezing, shortness of breath, coughing fits, tightness in the chest.  However the daily preventative inhaler is meant to improve your symptoms overall and you are exercise tolerance.  Lets plan to see you back in 1 month in regards to the new inhaler.     Chronic Obstructive Pulmonary Disease Chronic obstructive pulmonary disease (COPD) is a long-term (chronic) lung problem. When you have COPD, it is hard for air to get in and out of your lungs. Usually the condition gets worse over time, and your lungs will never return to normal. There are things you can do to keep yourself as healthy as possible.  Your doctor may treat your condition with: ? Medicines. ? Oxygen. ? Lung surgery.  Your doctor may also recommend: ? Rehabilitation. This includes steps to make your body work better. It may involve a team of specialists. ? Quitting smoking, if you smoke. ? Exercise and changes to your diet. ? Comfort measures (palliative care). Follow these instructions at home:  Medicines  Take over-the-counter and prescription medicines only as told by your doctor.  Talk to your doctor before taking any cough or allergy medicines. You may need to avoid medicines that cause your lungs to be dry. Lifestyle  If you smoke, stop. Smoking makes the problem worse. If you need help quitting, ask your doctor.  Avoid being around things that make your breathing worse. This may include smoke, chemicals, and fumes.  Stay active, but remember to rest as well.  Learn and use tips on how to relax.  Make sure you get enough sleep. Most adults need at least 7 hours of sleep every night.  Eat healthy foods. Eat smaller meals more often. Rest before meals. Controlled breathing Learn and use tips on how to control your breathing as told by your doctor. Try:  Breathing in (inhaling) through your nose for 1 second. Then, pucker your lips and breath out (exhale) through your lips for 2 seconds.  Putting one hand on your belly (abdomen). Breathe in slowly through your nose for 1 second. Your hand on your belly should move out. Pucker your lips and breathe out slowly through your lips. Your hand on your belly should move in as you breathe out.  Controlled coughing Learn and use controlled coughing to clear mucus from your lungs. Follow these steps: 1. Lean your head a little forward. 2. Breathe in deeply. 3. Try to hold your breath for 3 seconds. 4. Keep your mouth  slightly open while coughing 2 times. 5. Spit any mucus out into a tissue. 6. Rest and do the steps again 1 or 2 times as needed. General instructions  Make sure you get all the shots (vaccines) that your doctor recommends. Ask your doctor about a flu shot and a pneumonia shot.  Use oxygen therapy and pulmonary rehabilitation if told by your doctor. If you need home oxygen therapy, ask your doctor if you should buy a tool to measure your oxygen level (oximeter).  Make a COPD action plan with your doctor. This  helps you to know what to do if you feel worse than usual.  Manage any other conditions you have as told by your doctor.  Avoid going outside when it is very hot, cold, or humid.  Avoid people who have a sickness you can catch (contagious).  Keep all follow-up visits as told by your doctor. This is important. Contact a doctor if:  You cough up more mucus than usual.  There is a change in the color or thickness of the mucus.  It is harder to breathe than usual.  Your breathing is faster than usual.  You have trouble sleeping.  You need to use your medicines more often than usual.  You have trouble doing your normal activities such as getting dressed or walking around the house. Get help right away if:  You have shortness of breath while resting.  You have shortness of breath that stops you from: ? Being able to talk. ? Doing normal activities.  Your chest hurts for longer than 5 minutes.  Your skin color is more blue than usual.  Your pulse oximeter shows that you have low oxygen for longer than 5 minutes.  You have a fever.  You feel too tired to breathe normally. Summary  Chronic obstructive pulmonary disease (COPD) is a long-term lung problem.  The way your lungs work will never return to normal. Usually the condition gets worse over time. There are things you can do to keep yourself as healthy as possible.  Take over-the-counter and prescription medicines only as told by your doctor.  If you smoke, stop. Smoking makes the problem worse. This information is not intended to replace advice given to you by your health care provider. Make sure you discuss any questions you have with your health care provider. Document Released: 02/05/2008 Document Revised: 08/01/2017 Document Reviewed: 09/23/2016 Elsevier Patient Education  2020 [employer].
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
## 2019-08-05 ENCOUNTER — Other Ambulatory Visit: Payer: Self-pay | Admitting: Medical

## 2019-08-05 ENCOUNTER — Telehealth: Payer: Self-pay

## 2019-08-05 DIAGNOSIS — I10 Essential (primary) hypertension: Secondary | ICD-10-CM

## 2019-08-05 LAB — COMPREHENSIVE METABOLIC PANEL
ALT: 34 IU/L (ref 0–44)
AST: 37 IU/L (ref 0–40)
Albumin/Globulin Ratio: 1.8 (ref 1.2–2.2)
Albumin: 4.8 g/dL (ref 3.8–4.9)
Alkaline Phosphatase: 99 IU/L (ref 39–117)
BUN/Creatinine Ratio: 14 (ref 9–20)
BUN: 17 mg/dL (ref 6–24)
Bilirubin Total: 0.6 mg/dL (ref 0.0–1.2)
CO2: 24 mmol/L (ref 20–29)
Calcium: 10.1 mg/dL (ref 8.7–10.2)
Chloride: 100 mmol/L (ref 96–106)
Creatinine, Ser: 1.24 mg/dL (ref 0.76–1.27)
GFR calc Af Amer: 75 mL/min/{1.73_m2} (ref >59)
GFR calc non Af Amer: 65 mL/min/{1.73_m2} (ref >59)
Globulin, Total: 2.7 g/dL (ref 1.5–4.5)
Glucose: 91 mg/dL (ref 65–99)
Potassium: 4.4 mmol/L (ref 3.5–5.2)
Sodium: 139 mmol/L (ref 134–144)
Total Protein: 7.5 g/dL (ref 6.0–8.5)

## 2019-08-05 LAB — CBC WITH DIFFERENTIAL/PLATELET
Basophils Absolute: 0 10*3/uL (ref 0.0–0.2)
Basos: 1 %
EOS (ABSOLUTE): 0.2 10*3/uL (ref 0.0–0.4)
Eos: 4 %
Hematocrit: 42.4 % (ref 37.5–51.0)
Hemoglobin: 13.4 g/dL (ref 13.0–17.7)
Immature Grans (Abs): 0 10*3/uL (ref 0.0–0.1)
Immature Granulocytes: 0 %
Lymphocytes Absolute: 1.6 10*3/uL (ref 0.7–3.1)
Lymphs: 41 %
MCH: 27.1 pg (ref 26.6–33.0)
MCHC: 31.6 g/dL (ref 31.5–35.7)
MCV: 86 fL (ref 79–97)
Monocytes Absolute: 0.5 10*3/uL (ref 0.1–0.9)
Monocytes: 12 %
Neutrophils Absolute: 1.7 10*3/uL (ref 1.4–7.0)
Neutrophils: 42 %
Platelets: 241 10*3/uL (ref 150–450)
RBC: 4.94 x10E6/uL (ref 4.14–5.80)
RDW: 13.9 % (ref 11.6–15.4)
WBC: 4 10*3/uL (ref 3.4–10.8)

## 2019-08-05 LAB — LIPID PANEL
Chol/HDL Ratio: 3.7 ratio (ref 0.0–5.0)
Cholesterol, Total: 177 mg/dL (ref 100–199)
HDL: 48 mg/dL (ref >39)
LDL Chol Calc (NIH): 119 mg/dL — ABNORMAL HIGH (ref 0–99)
Triglycerides: 49 mg/dL (ref 0–149)
VLDL Cholesterol Cal: 10 mg/dL (ref 5–40)

## 2019-08-05 LAB — TSH: TSH: 1.4 u[IU]/mL (ref 0.450–4.500)

## 2019-08-05 LAB — HEMOGLOBIN A1C
Est. average glucose Bld gHb Est-mCnc: 126 mg/dL
Hgb A1c MFr Bld: 6 % — ABNORMAL HIGH (ref 4.8–5.6)

## 2019-08-05 MED ORDER — ASPIRIN 81 MG PO TBEC: 81.0000 mg | DELAYED_RELEASE_TABLET | Freq: Every day | ORAL | 3 refills | Status: DC

## 2019-08-05 MED ORDER — ROSUVASTATIN CALCIUM 5 MG PO TABS: 5.0000 mg | ORAL_TABLET | Freq: Every day | ORAL | 0 refills | Status: DC

## 2019-08-05 MED ORDER — ALBUTEROL SULFATE HFA 108 (90 BASE) MCG/ACT IN AERS: 2.0000 | INHALATION_SPRAY | Freq: Four times a day (QID) | RESPIRATORY_TRACT | 0 refills | Status: DC | PRN

## 2019-08-05 MED ORDER — LOSARTAN POTASSIUM-HCTZ 50-12.5 MG PO TABS: 1.0000 | ORAL_TABLET | Freq: Every day | ORAL | 3 refills | Status: DC

## 2019-08-05 NOTE — Telephone Encounter (Signed)
I received a fax from [facility] stating that the pt. ProAir HFA inhaler needs a PA but if you could switch it to Ventolin HFA a PA is not required. Please advise or switch medication.

## 2019-08-25 ENCOUNTER — Other Ambulatory Visit: Payer: Self-pay | Admitting: Medical

## 2019-08-25 NOTE — Telephone Encounter (Signed)
[facility] is requesting to fill pt albuterol. Please advise KH 

## 2019-09-15 ENCOUNTER — Other Ambulatory Visit: Payer: Self-pay | Admitting: Medical

## 2019-09-16 NOTE — Telephone Encounter (Signed)
He is due for 1 month f/u on starting Anoro inhaler for maintenance.  Refill abluterol x1

## 2019-09-17 NOTE — Telephone Encounter (Signed)
Patient informed to schedule appointment.

## 2019-12-16 IMAGING — CR DG CHEST 2V
2 series · 2 of 2 positions shown · non-contrast
Comparison: None in PACs

CLINICAL DATA: Cough and wheezing for the past month. History of
hypertension. Former smoker.

EXAM:
CHEST  2 VIEW

[w chest pa]
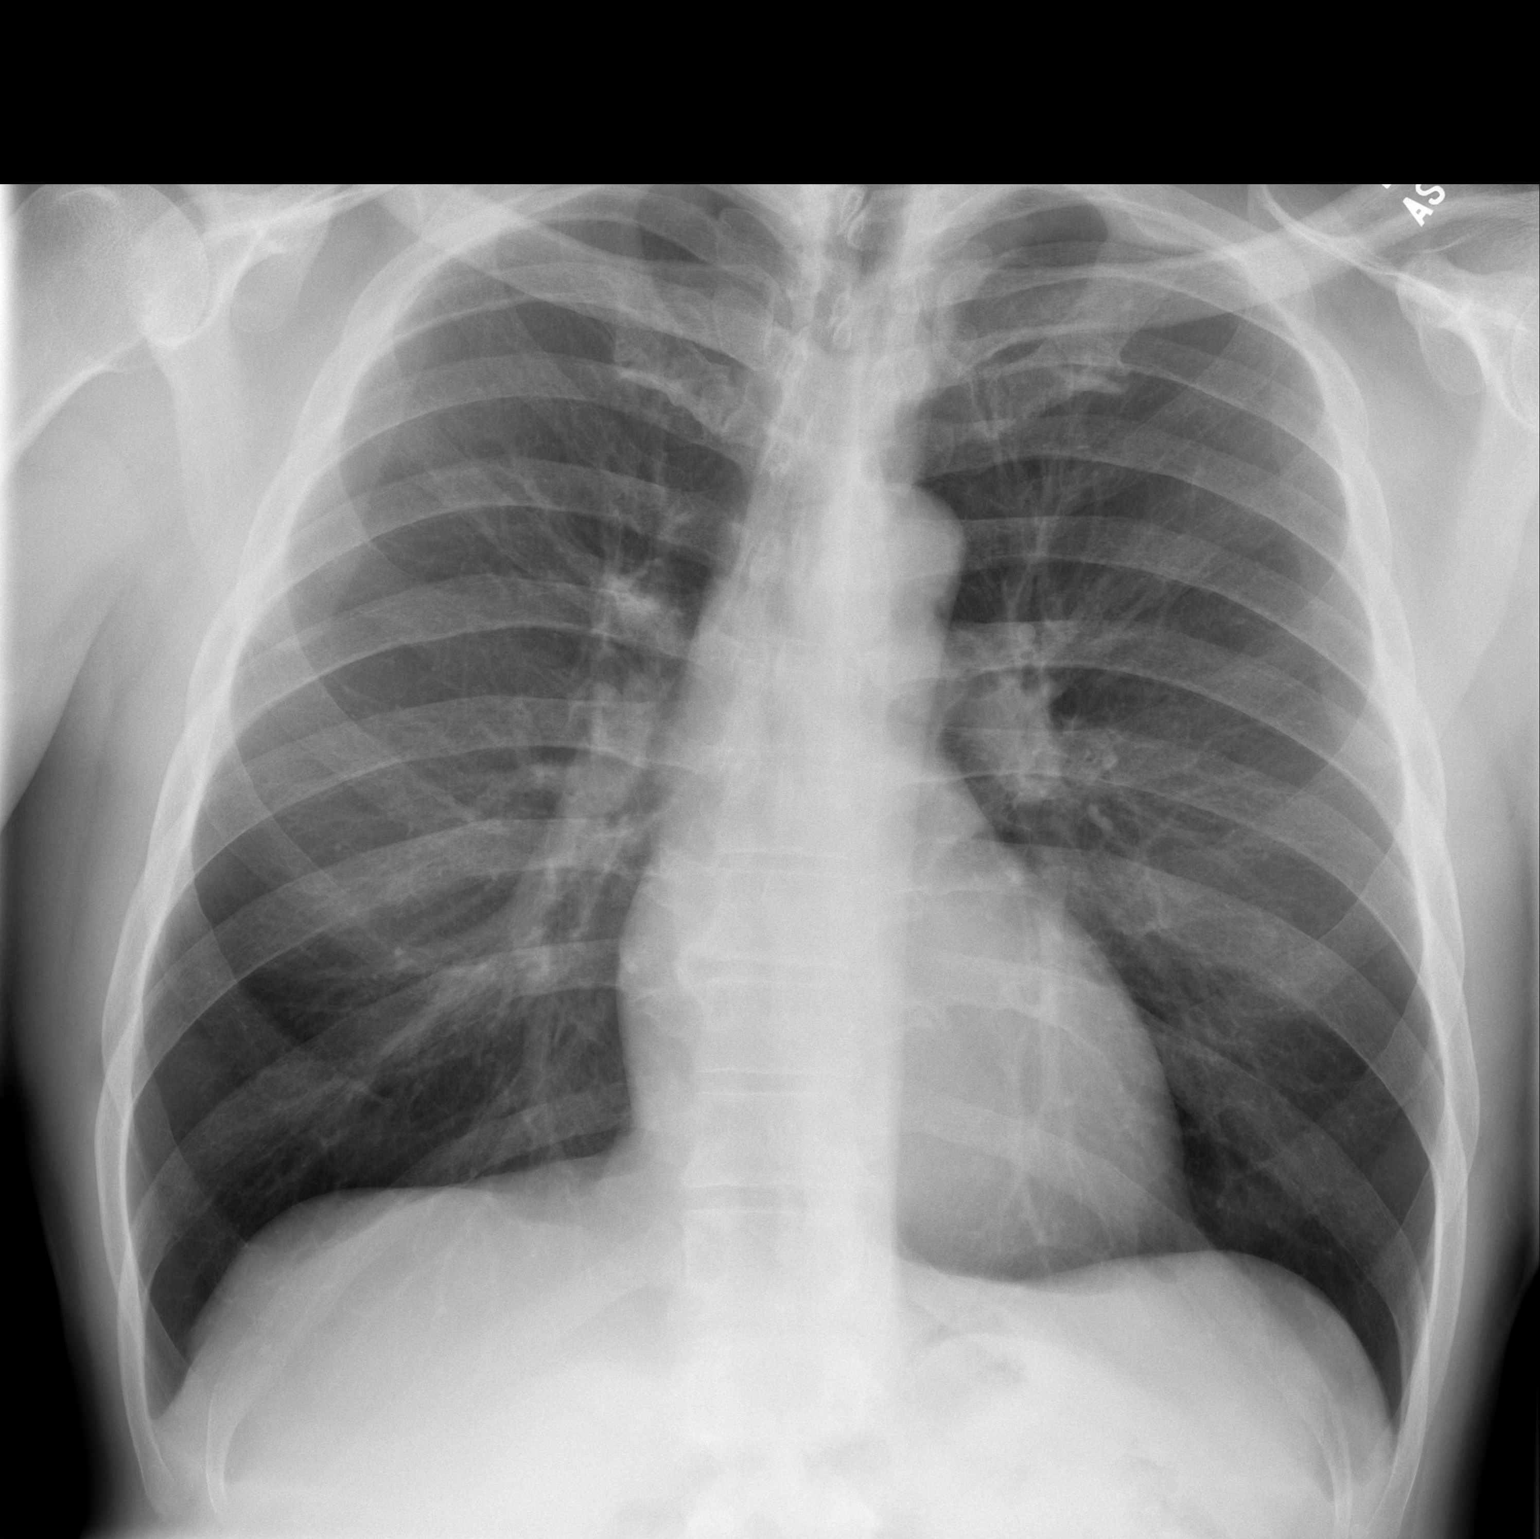

[w chest lat *]
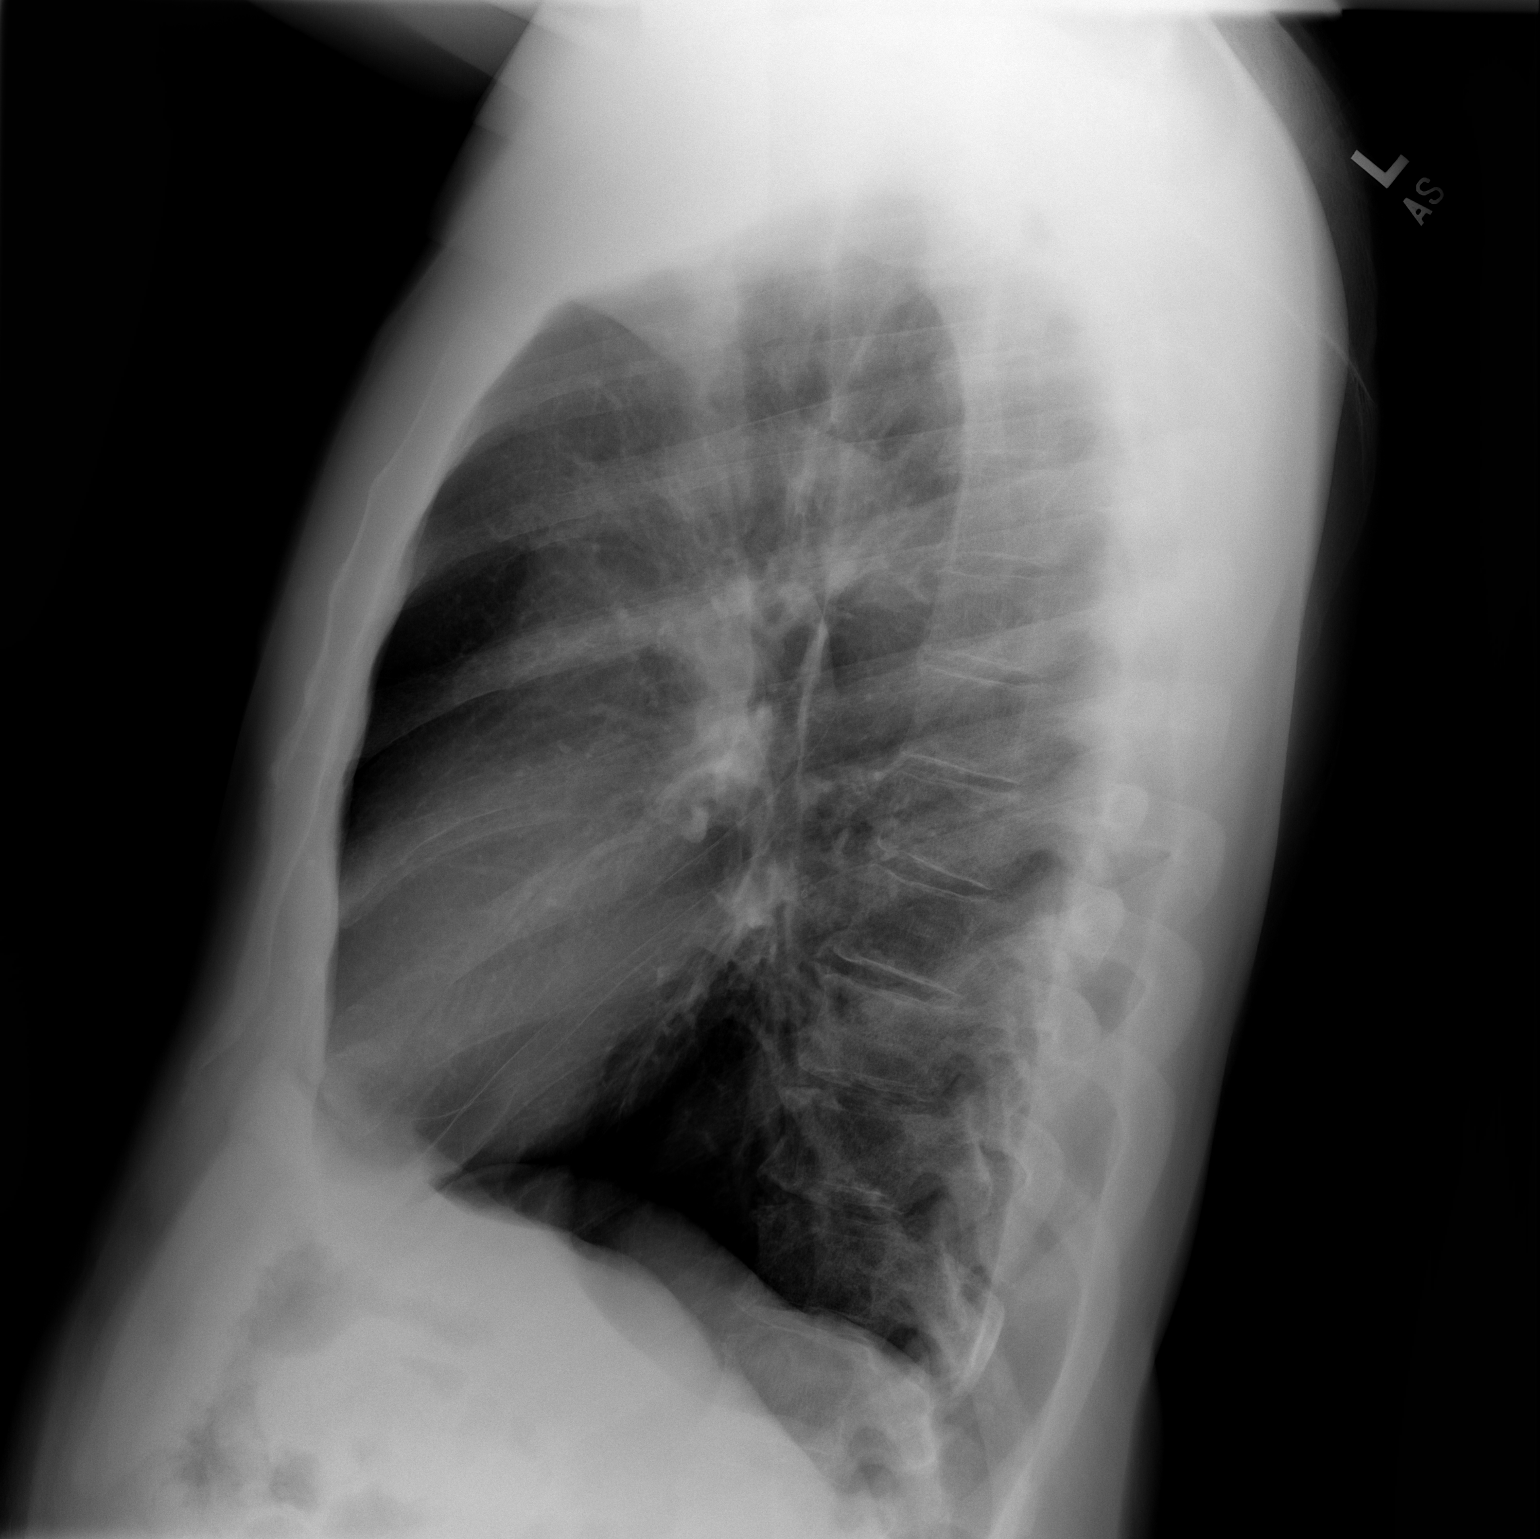

[2 of 2 positions shown; findings below may reference images not displayed]

FINDINGS: The lungs are hyperinflated with hemidiaphragm flattening. The heart
and pulmonary vascularity are normal. The mediastinum is normal in
width. The bony thorax is unremarkable.
IMPRESSION: Hyperinflation consistent with reactive airway disease or COPD. No
pneumonia nor other acute cardiopulmonary abnormality.

## 2020-01-26 ENCOUNTER — Telehealth: Payer: Self-pay | Admitting: Internal Medicine

## 2020-01-26 NOTE — Telephone Encounter (Signed)
Get him in to check BP and discuss, eval

## 2020-01-26 NOTE — Telephone Encounter (Signed)
Pt's wife Calvin Reid called and states pt had to go in for a phsyical for a job and his bp was 180/100 and they advised him to contact his pcp. Pt has been taking his bp pill like he is suppose to. Please advise and call cora back as pt has gone home to sleep. Pt is suppose to go back next week for a recheck

## 2020-01-26 NOTE — Telephone Encounter (Signed)
Patient has an appointment scheduled for tomorrow.

## 2020-01-27 ENCOUNTER — Encounter: Payer: Self-pay | Admitting: Medical

## 2020-01-27 ENCOUNTER — Ambulatory Visit: Payer: BC Managed Care – PPO | Admitting: Medical

## 2020-01-27 ENCOUNTER — Other Ambulatory Visit: Payer: Self-pay

## 2020-01-27 VITALS — BP 156/84 | HR 63 | Ht 73.0 in | Wt 184.0 lb

## 2020-01-27 DIAGNOSIS — R7301 Impaired fasting glucose: Secondary | ICD-10-CM | POA: Diagnosis not present

## 2020-01-27 DIAGNOSIS — R7989 Other specified abnormal findings of blood chemistry: Secondary | ICD-10-CM

## 2020-01-27 DIAGNOSIS — I517 Cardiomegaly: Secondary | ICD-10-CM

## 2020-01-27 DIAGNOSIS — Z136 Encounter for screening for cardiovascular disorders: Secondary | ICD-10-CM

## 2020-01-27 DIAGNOSIS — I10 Essential (primary) hypertension: Secondary | ICD-10-CM | POA: Diagnosis not present

## 2020-01-27 DIAGNOSIS — E785 Hyperlipidemia, unspecified: Secondary | ICD-10-CM

## 2020-01-27 DIAGNOSIS — K760 Fatty (change of) liver, not elsewhere classified: Secondary | ICD-10-CM | POA: Insufficient documentation

## 2020-01-27 DIAGNOSIS — Z87891 Personal history of nicotine dependence: Secondary | ICD-10-CM

## 2020-01-27 DIAGNOSIS — Z8249 Family history of ischemic heart disease and other diseases of the circulatory system: Secondary | ICD-10-CM

## 2020-01-27 MED ORDER — LOSARTAN POTASSIUM-HCTZ 100-25 MG PO TABS: 1.0000 | ORAL_TABLET | Freq: Every day | ORAL | 0 refills | Status: DC

## 2020-01-27 MED ORDER — ROSUVASTATIN CALCIUM 5 MG PO TABS: 5.0000 mg | ORAL_TABLET | Freq: Every day | ORAL | 0 refills | Status: DC

## 2020-01-27 NOTE — Patient Instructions (Addendum)
Encounter Diagnoses  Name Primary?  . Essential hypertension Yes  . LVH (left ventricular hypertrophy)   . Impaired fasting blood sugar   . Family history of premature CAD   . Former smoker   . Hyperlipidemia, unspecified hyperlipidemia type   . Fatty liver   . Elevated LFTs     Recommendations: High blood pressure, history of enlarged left ventricle of the heart based on 2017 ultrasound  Increase to the highest dose of losartan HCT 100/25 mg daily  Limit your salt intake including limiting soda, canned foods, fried food junk food and fast food  Exercise regularly most days per week  Drink plenty water throughout the day  Return in 3 weeks for recheck on blood pressure and labs  Impaired glucose, borderline for diabetes  Return in 3 weeks to recheck labs and to recheck blood pressure  Limit high carbohydrates such as potatoes, junk food, cakes, pasta, potatoes, and soda  Elevated liver test  Your liver tests have been elevated in the past.  We screened for hepatitis infection which was negative.  Your ultrasound showed fatty liver disease  The main treatment for fatty liver disease is a healthy low-fat diet  At this time you can restart Crestor for cholesterol to reduce risk for heart disease  Heart disease screening  You have risk factors for heart disease including high blood pressure, high cholesterol, family history of heart disease at a young age with your sister  It is important to take cholesterol medicine aspirin daily at bedtime to reduce your risk of heart disease  It is important to maintain a healthy blood pressure reading  I want you to consider having a calcium CT score which is a special test to x-ray of the heart arteries to look for buildup of cholesterol and calcium.  My understanding is that this can be done for $125 out-of-pocket through one of the local offices  Another option is to go back and see heart doctor for follow-up since his been 4  years since your last ultrasound    Fatty Liver Disease  Fatty liver disease occurs when too much fat has built up in your liver cells. Fatty liver disease is also called hepatic steatosis or steatohepatitis. The liver removes harmful substances from your bloodstream and produces fluids that your body needs. It also helps your body use and store energy from the food you eat. In many cases, fatty liver disease does not cause symptoms or problems. It is often diagnosed when tests are being done for other reasons. However, over time, fatty liver can cause inflammation that may lead to more serious liver problems, such as scarring of the liver (cirrhosis) and liver failure. Fatty liver is associated with insulin resistance, increased body fat, high blood pressure (hypertension), and high cholesterol. These are features of metabolic syndrome and increase your risk for stroke, diabetes, and heart disease. What are the causes? This condition may be caused by:  Drinking too much alcohol.  Poor nutrition.  Obesity.  Cushing's syndrome.  Diabetes.  High cholesterol.  Certain drugs.  Poisons.  Some viral infections.  Pregnancy. What increases the risk? You are more likely to develop this condition if you:  Abuse alcohol.  Are overweight.  Have diabetes.  Have hepatitis.  Have a high triglyceride level.  Are pregnant. What are the signs or symptoms? Fatty liver disease often does not cause symptoms. If symptoms do develop, they can include:  Fatigue.  Weakness.  Weight loss.  Confusion.  Abdominal pain.  Nausea and vomiting.  Yellowing of your skin and the white parts of your eyes (jaundice).  Itchy skin. How is this diagnosed? This condition may be diagnosed by:  A physical exam and medical history.  Blood tests.  Imaging tests, such as an ultrasound, CT scan, or MRI.  A liver biopsy. A small sample of liver tissue is removed using a needle. The sample is  then looked at under a microscope. How is this treated? Fatty liver disease is often caused by other health conditions. Treatment for fatty liver may involve medicines and lifestyle changes to manage conditions such as:  Alcoholism.  High cholesterol.  Diabetes.  Being overweight or obese. Follow these instructions at home:   Do not drink alcohol. If you have trouble quitting, ask your health care provider how to safely quit with the help of medicine or a supervised program. This is important to keep your condition from getting worse.  Eat a healthy diet as told by your health care provider. Ask your health care provider about working with a diet and nutrition specialist (dietitian) to develop an eating plan.  Exercise regularly. This can help you lose weight and control your cholesterol and diabetes. Talk to your health care provider about an exercise plan and which activities are best for you.  Take over-the-counter and prescription medicines only as told by your health care provider.  Keep all follow-up visits as told by your health care provider. This is important. Contact a health care provider if: You have trouble controlling your:  Blood sugar. This is especially important if you have diabetes.  Cholesterol.  Drinking of alcohol. Get help right away if:  You have abdominal pain.  You have jaundice.  You have nausea and vomiting.  You vomit blood or material that looks like coffee grounds.  You have stools that are black, tar-like, or bloody. Summary  Fatty liver disease develops when too much fat builds up in the cells of your liver.  Fatty liver disease often causes no symptoms or problems. However, over time, fatty liver can cause inflammation that may lead to more serious liver problems, such as scarring of the liver (cirrhosis).  You are more likely to develop this condition if you abuse alcohol, are pregnant, are overweight, have diabetes, have hepatitis,  or have high triglyceride levels.  Contact your health care provider if you have trouble controlling your weight, blood sugar, cholesterol, or drinking of alcohol. This information is not intended to replace advice given to you by your health care provider. Make sure you discuss any questions you have with your health care provider. Document Revised: 08/01/2017 Document Reviewed: 05/28/2017 Elsevier Patient Education  2020 [employer].

## 2020-01-27 NOTE — Progress Notes (Signed)
Subjective:  Calvin Reid is a 54 y.o. male who presents for Chief Complaint  Patient presents with  . Hypertension     Calvin Reid has a history of hypertension, impaired glucose, hyperlipidemia, premature family history of coronary artery disease, former smoker here today for elevated blood pressures.  He reports compliance with losartan HCT 50/12.5 mg daily.  He went to apply for a new job recently and BP was high on screen.  He notes eating some pork prior to this. He felt this contributed to the readings.  At the screening BP was 178/100, second reading 180/88.  No chest pain, no dyspnea, no palpitations.   No edema, no DOE.  Hyperlipidemia-he has not been taking his Crestor.  This was temporarily stopped 2019 with elevated LFTs.  He never started this back.   He is taking aspirin daily at bedtime.  Exercise - lots of walking.  Diet - tries to avoid salt, drinks some soda, some chips, but not a lot of junk food.   No other aggravating or relieving factors.    No other c/o.  The following portions of the patient's history were reviewed and updated as appropriate: allergies, current medications, past family history, past medical history, past social history, past surgical history and problem list.  Past Medical History:  Diagnosis Date  . Elevated LFTs 07/2016  . Former smoker    quit 04/2016  . H/O cardiovascular stress test 03/08/2009   stress echo normal.   Calvin Reid  . Hyperlipidemia   . Hypertension   . Impaired fasting blood sugar   . Wears partial dentures    Current Outpatient Medications on File Prior to Visit  Medication Sig Dispense Refill  . albuterol (VENTOLIN HFA) 108 (90 Base) MCG/ACT inhaler TAKE 2 PUFFS BY MOUTH EVERY 6 HOURS AS NEEDED FOR WHEEZE OR SHORTNESS OF BREATH 18 g 0  . aspirin 81 MG EC tablet Take 1 tablet (81 mg total) by mouth daily. 90 tablet 3  . sildenafil (VIAGRA) 100 MG tablet 1/2-1 tablet po daily prn 30 tablet 5  .  umeclidinium-vilanterol (ANORO ELLIPTA) 62.5-25 MCG/INH AEPB Inhale 1 puff into the lungs daily. 60 each 1   Current Facility-Administered Medications on File Prior to Visit  Medication Dose Route Frequency Provider Last Rate Last Admin  . 0.9 %  sodium chloride infusion  500 mL Intravenous Continuous Calvin Artist, MD       Family History  Problem Relation Age of Onset  . Hypertension Mother   . COPD Father        died in his 22s  . Heart disease Sister 22       CAD  . Heart disease Son        arrhythmia  . Heart disease Son   . Diabetes Brother   . Stroke Neg Hx   . Cancer Neg Hx   . Colon cancer Neg Hx      ROS Otherwise as in subjective above    Objective: BP (!) 156/84   Pulse 63   Ht 6\' 1"  (1.854 m)   Wt 184 lb (83.5 kg)   SpO2 98%   BMI 24.28 kg/m   I personally verified BP myself which was similar  BP Readings from Last 3 Encounters:  01/27/20 (!) 156/84  08/04/19 134/88  07/24/18 126/80   Wt Readings from Last 3 Encounters:  01/27/20 184 lb (83.5 kg)  08/04/19 199 lb (90.3 kg)  07/24/18 203 lb 9.6 oz (  92.4 kg)    General appearance: alert, no distress, well developed, well nourished Neck: supple, no lymphadenopathy, no thyromegaly, no masses, no bruits Heart: RRR, normal S1, S2, no murmurs Lungs: CTA bilaterally, no wheezes, rhonchi, or rales Pulses: 2+ radial pulses, 2+ pedal pulses, normal cap refill Ext: no edema   Assessment: Encounter Diagnoses  Name Primary?  . Essential hypertension Yes  . LVH (left ventricular hypertrophy)   . Impaired fasting blood sugar   . Family history of premature CAD   . Former smoker   . Hyperlipidemia, unspecified hyperlipidemia type   . Fatty liver   . Elevated LFTs   . Screening for heart disease      Plan: Please elevated blood pressures at employee screening may have been elevated in part due to diet indiscretion, excess salt intake.  He is readings today were elevated but not as high as he got  his recent screening  I reviewed his echocardiogram from September 2017.  At that time he had mild LVH.  He saw Calvin Reid who felt he needed no other cardiac eval at that time.  I reviewed labs from December 2020 showing LDL 119 elevated, borderline for diabetes with a hemoglobin A1c of 6.0%, otherwise comprehensive metabolic panel, blood count, thyroid were normal.  We discussed the following recommendations  Recommendations: High blood pressure, history of enlarged left ventricle of the heart based on 2017 ultrasound  Increase to the highest dose of losartan HCT 100/25 mg daily  Limit your salt intake including limiting soda, canned foods, fried food junk food and fast food  Exercise regularly most days per week  Drink plenty water throughout the day  Return in 3 weeks for recheck on blood pressure and labs  Impaired glucose, borderline for diabetes  Return in 3 weeks to recheck labs and to recheck blood pressure  Limit high carbohydrates such as potatoes, junk food, cakes, pasta, potatoes, and soda  Elevated liver test  Your liver tests have been elevated in the past.  We screened for hepatitis infection which was negative.  Your ultrasound showed fatty liver disease  The main treatment for fatty liver disease is a healthy low-fat diet  At this time you can restart Crestor for cholesterol to reduce risk for heart disease  Heart disease screening  You have risk factors for heart disease including high blood pressure, high cholesterol, family history of heart disease at a young age with your sister  It is important to take cholesterol medicine aspirin daily at bedtime to reduce your risk of heart disease  It is important to maintain a healthy blood pressure reading  I want you to consider having a calcium CT score which is a special test to x-ray of the heart arteries to look for buildup of cholesterol and calcium.  My understanding is that this can be done for $125  out-of-pocket through one of the local offices  Another option is to go back and see heart doctor for follow-up since his been 4 years since your last ultrasound   Calvin Reid was seen today for hypertension.  Diagnoses and all orders for this visit:  Essential hypertension -     Cancel: Comprehensive metabolic panel  LVH (left ventricular hypertrophy)  Impaired fasting blood sugar -     Cancel: Hemoglobin A1c  Family history of premature CAD  Former smoker  Hyperlipidemia, unspecified hyperlipidemia type -     Cancel: Comprehensive metabolic panel  Fatty liver  Elevated LFTs  Screening for  heart disease  Other orders -     losartan-hydrochlorothiazide (HYZAAR) 100-25 MG tablet; Take 1 tablet by mouth daily. -     rosuvastatin (CRESTOR) 5 MG tablet; Take 1 tablet (5 mg total) by mouth at bedtime.    Follow up: 3 weeks fasting

## 2020-02-18 ENCOUNTER — Other Ambulatory Visit: Payer: Self-pay | Admitting: Medical

## 2020-03-05 ENCOUNTER — Other Ambulatory Visit: Payer: Self-pay | Admitting: Medical

## 2020-03-11 ENCOUNTER — Other Ambulatory Visit: Payer: Self-pay | Admitting: Medical

## 2020-06-08 ENCOUNTER — Telehealth: Payer: Self-pay | Admitting: Family Medicine

## 2020-06-08 NOTE — Telephone Encounter (Signed)
Wife called, they cannot afford the Anoro yet as their deductible is not filled.  The Anoro will cost 400.00. Is it ok to give them samples Anoro 62.5/25mcg

## 2020-06-08 NOTE — Telephone Encounter (Signed)
Will advise pt 

## 2020-06-08 NOTE — Telephone Encounter (Signed)
Yes sample is fine.  He should call insurance to verify which preventative inhalers they cover in case we need to switch  Examples include: Breo Advair Symbicort Trelegy

## 2020-08-24 ENCOUNTER — Other Ambulatory Visit: Payer: Self-pay | Admitting: Medical

## 2020-08-28 NOTE — Telephone Encounter (Signed)
Requesting refill on Aspirin has no future apt. Last apt was 01/27/20.

## 2020-08-29 ENCOUNTER — Other Ambulatory Visit: Payer: Self-pay | Admitting: Medical

## 2020-08-29 DIAGNOSIS — I10 Essential (primary) hypertension: Secondary | ICD-10-CM

## 2020-08-29 NOTE — Telephone Encounter (Signed)
Pt is scheduled for next Wednesday for med check but pt states he went back down on lower dose of this med as the higher dose makes him urinate too much. Please advise if ok to fill this lower dose

## 2020-09-06 ENCOUNTER — Other Ambulatory Visit: Payer: Self-pay

## 2020-09-06 ENCOUNTER — Encounter: Payer: Self-pay | Admitting: Medical

## 2020-09-06 ENCOUNTER — Ambulatory Visit (INDEPENDENT_AMBULATORY_CARE_PROVIDER_SITE_OTHER): Payer: BC Managed Care – PPO | Admitting: Medical

## 2020-09-06 VITALS — BP 128/82 | HR 60 | Ht 73.0 in | Wt 192.2 lb

## 2020-09-06 DIAGNOSIS — Z0000 Encounter for general adult medical examination without abnormal findings: Secondary | ICD-10-CM

## 2020-09-06 DIAGNOSIS — Z87891 Personal history of nicotine dependence: Secondary | ICD-10-CM

## 2020-09-06 DIAGNOSIS — Z7185 Encounter for immunization safety counseling: Secondary | ICD-10-CM

## 2020-09-06 DIAGNOSIS — N529 Male erectile dysfunction, unspecified: Secondary | ICD-10-CM

## 2020-09-06 DIAGNOSIS — R7301 Impaired fasting glucose: Secondary | ICD-10-CM | POA: Diagnosis not present

## 2020-09-06 DIAGNOSIS — Z125 Encounter for screening for malignant neoplasm of prostate: Secondary | ICD-10-CM

## 2020-09-06 DIAGNOSIS — Z23 Encounter for immunization: Secondary | ICD-10-CM

## 2020-09-06 DIAGNOSIS — E785 Hyperlipidemia, unspecified: Secondary | ICD-10-CM

## 2020-09-06 DIAGNOSIS — K760 Fatty (change of) liver, not elsewhere classified: Secondary | ICD-10-CM

## 2020-09-06 DIAGNOSIS — I517 Cardiomegaly: Secondary | ICD-10-CM | POA: Diagnosis not present

## 2020-09-06 DIAGNOSIS — Z8249 Family history of ischemic heart disease and other diseases of the circulatory system: Secondary | ICD-10-CM

## 2020-09-06 DIAGNOSIS — I10 Essential (primary) hypertension: Secondary | ICD-10-CM

## 2020-09-06 DIAGNOSIS — J42 Unspecified chronic bronchitis: Secondary | ICD-10-CM

## 2020-09-06 MED ORDER — ROSUVASTATIN CALCIUM 5 MG PO TABS: 5.0000 mg | ORAL_TABLET | Freq: Every day | ORAL | 3 refills | Status: DC

## 2020-09-06 MED ORDER — LOSARTAN POTASSIUM-HCTZ 50-12.5 MG PO TABS: 1.0000 | ORAL_TABLET | Freq: Every day | ORAL | 3 refills | Status: DC

## 2020-09-06 MED ORDER — BUDESONIDE-FORMOTEROL FUMARATE 80-4.5 MCG/ACT IN AERO
2.0000 | INHALATION_SPRAY | Freq: Two times a day (BID) | RESPIRATORY_TRACT | 12 refills | Status: DC
Start: 2020-09-06 — End: 2021-10-26

## 2020-09-06 MED ORDER — ALBUTEROL SULFATE HFA 108 (90 BASE) MCG/ACT IN AERS: INHALATION_SPRAY | RESPIRATORY_TRACT | 0 refills | Status: DC

## 2020-09-06 NOTE — Progress Notes (Signed)
Subjective:   HPI  Calvin Reid is a 55 y.o. male who presents for Chief Complaint  Patient presents with  . Medication Management    With fasting labs     Patient Care Team: Delante Karapetyan, Leward Quan as PCP - General (Family Medicine) [place]ique, Peter M, MD as Consulting Physician (Cardiology) Ladene Artist, MD as Consulting Physician (Gastroenterology) Sees dentist Sees eye doctor  Concerns: Hypertension-compliant with medication without complaint  Hyperlipidemia-he got confused since last visit and has not been taking statin.  He thought this was discontinued  COPD-uses Anoro daily, albuterol occasionally and the Anoro works well.  However it is very expensive and needs to change to something else  Reviewed their medical, surgical, family, social, medication, and allergy history and updated chart as appropriate.  Past Medical History:  Diagnosis Date  . Elevated LFTs 07/2016  . Former smoker    quit 04/2016  . H/O cardiovascular stress test 03/08/2009   stress echo normal.   Dr. Peter Martinique  . Hyperlipidemia   . Hypertension   . Impaired fasting blood sugar   . Wears partial dentures     Past Surgical History:  Procedure Laterality Date  . COLONOSCOPY  06/11/2016   repeat 2027.   Dr. Kennedy Bucker, colon polyps  . WISDOM TOOTH EXTRACTION      Family History  Problem Relation Age of Onset  . Hypertension Mother   . COPD Father        died in his 22s  . Heart disease Sister 48       CAD  . Heart disease Son        arrhythmia  . Heart disease Son   . Diabetes Brother   . Stroke Neg Hx   . Cancer Neg Hx   . Colon cancer Neg Hx      Current Outpatient Medications:  .  ASPIRIN LOW DOSE 81 MG EC tablet, TAKE 1 TABLET BY MOUTH EVERY DAY, Disp: 90 tablet, Rfl: 3 .  budesonide-formoterol (SYMBICORT) 80-4.5 MCG/ACT inhaler, Inhale 2 puffs into the lungs in the morning and at bedtime., Disp: 1 each, Rfl: 12 .  sildenafil (VIAGRA) 100 MG tablet, 1/2-1 tablet  po daily prn, Disp: 30 tablet, Rfl: 5 .  albuterol (VENTOLIN HFA) 108 (90 Base) MCG/ACT inhaler, TAKE 2 PUFFS BY MOUTH EVERY 6 HOURS AS NEEDED FOR WHEEZE OR SHORTNESS OF BREATH, Disp: 18 g, Rfl: 0 .  losartan-hydrochlorothiazide (HYZAAR) 50-12.5 MG tablet, Take 1 tablet by mouth daily., Disp: 90 tablet, Rfl: 3 .  rosuvastatin (CRESTOR) 5 MG tablet, Take 1 tablet (5 mg total) by mouth at bedtime., Disp: 90 tablet, Rfl: 3  Current Facility-Administered Medications:  .  0.9 %  sodium chloride infusion, 500 mL, Intravenous, Continuous, Ladene Artist, MD  No Known Allergies     Review of Systems Constitutional: -fever, -chills, -sweats, -unexpected weight change, -decreased appetite, -fatigue Allergy: -sneezing, -itching, -congestion Dermatology: -changing moles, --rash, -lumps ENT: -runny nose, -ear pain, -sore throat, -hoarseness, -sinus pain, -teeth pain, - ringing in ears, -hearing loss, -nosebleeds Cardiology: -chest pain, -palpitations, -swelling, -difficulty breathing when lying flat, -waking up short of breath Respiratory: -cough, -shortness of breath, -difficulty breathing with exercise or exertion, -wheezing, -coughing up blood Gastroenterology: -abdominal pain, -nausea, -vomiting, -diarrhea, -constipation, -blood in stool, -changes in bowel movement, -difficulty swallowing or eating Hematology: -bleeding, -bruising  Musculoskeletal: -joint aches, -muscle aches, -joint swelling, -back pain, -neck pain, -cramping, -changes in gait Ophthalmology: denies vision changes, eye redness,  itching, discharge Urology: -burning with urination, -difficulty urinating, -blood in urine, -urinary frequency, -urgency, -incontinence Neurology: -headache, -weakness, -tingling, -numbness, -memory loss, -falls, -dizziness Psychology: -depressed mood, -agitation, -sleep problems Male GU: no testicular mass, pain, no lymph nodes swollen, no swelling, no rash.     Objective:  BP 128/82   Pulse 60    Ht 6\' 1"  (1.854 m)   Wt 192 lb 3.2 oz (87.2 kg)   SpO2 98%   BMI 25.36 kg/m   General appearance: alert, no distress, WD/WN, African American male Skin: unremarkable HEENT: normocephalic, conjunctiva/corneas normal, sclerae anicteric, PERRLA, EOMi, nares patent, no discharge or erythema, pharynx normal Oral cavity: MMM, tongue normal, teeth normal Neck: supple, no lymphadenopathy, no thyromegaly, no masses, normal ROM, no bruits Chest: non tender, normal shape and expansion Heart: RRR, normal S1, S2, no murmurs Lungs: CTA bilaterally, no wheezes, rhonchi, or rales Abdomen: +bs, soft, non tender, non distended, no masses, no hepatomegaly, no splenomegaly, no bruits Back: non tender, normal ROM, no scoliosis Musculoskeletal: upper extremities non tender, no obvious deformity, normal ROM throughout, lower extremities non tender, no obvious deformity, normal ROM throughout Extremities: no edema, no cyanosis, no clubbing Pulses: 2+ symmetric, upper and lower extremities, normal cap refill Neurological: alert, oriented x 3, CN2-12 intact, strength normal upper extremities and lower extremities, sensation normal throughout, DTRs 2+ throughout, no cerebellar signs, gait normal Psychiatric: normal affect, behavior normal, pleasant  Rectal:deferred   Assessment and Plan :   Encounter Diagnoses  Name Primary?  . Encounter for health maintenance examination in adult Yes  . Essential hypertension   . LVH (left ventricular hypertrophy)   . Impaired fasting blood sugar   . Fatty liver   . Vaccine counseling   . Need for influenza vaccination   . Hyperlipidemia, unspecified hyperlipidemia type   . Family history of premature CAD   . Erectile dysfunction, unspecified erectile dysfunction type   . Need for COVID-19 vaccine   . Screening for prostate cancer   . Chronic bronchitis, unspecified chronic bronchitis type (HCC)   . Former smoker     Today you had a  preventative care visit or wellness visit.    Topics today may have included healthy lifestyle, diet, exercise, preventative care, vaccinations, sick and well care, proper use of emergency dept and after hours care, as well as other concerns.     Recommendations: Continue to return yearly for your annual wellness and preventative care visits.  This gives [id number] a chance to discuss healthy lifestyle, exercise, vaccinations, review your chart record, and perform screenings where appropriate.  I recommend you see your eye doctor yearly for routine vision care.  I recommend you see your dentist yearly for routine dental care including hygiene visits twice yearly.   Vaccination recommendations were reviewed Counseled on [place] Covid virus booster vaccine.  Vaccine information sheet given.  Covid vaccine given after consent obtained.   Counseled on the influenza virus vaccine.  Vaccine information sheet given.  Influenza vaccine given after consent obtained.  He will return in a few weeks for updated Td vaccine    Screening for cancer: Colon cancer screening:  I reviewed your colonoscopy on file that is up to date  We discussed PSA, prostate exam, and prostate cancer screening risks/benefits.     Skin cancer screening: Check your skin regularly for new changes, growing lesions, or other lesions of concern Come in for evaluation if you have skin lesions of concern.  Lung cancer screening: If  you have a greater than 30 pack year history of tobacco use, then you qualify for lung cancer screening with a chest CT scan  We currently don't have screenings for other cancers besides breast, cervical, colon, and lung cancers.  If you have a strong family history of cancer or have other cancer screening concerns, please let me know.    Bone health: Get at least 150 minutes of aerobic exercise weekly Get weight bearing exercise at least once weekly    Heart health: Get at least 150 minutes  of aerobic exercise weekly Limit alcohol It is important to maintain a healthy blood pressure and healthy cholesterol numbers    Separate significant issues discussed: Hypertension-continue current medication, advised home blood pressure monitoring  Hyperlipidemia-restart statin.  Advised that he was not supposed to stop this  COPD-changed to Symbicort since Anoro not covered.  Continue albuterol as needed  Recheck labs today on prior impaired fasting glucose, fatty liver disease  Counseled on need for healthy diet regular exercise  Fatty liver disease-continue efforts with low-fat diet, reviewed the ultrasound from January 2020 showing fatty liver  Clancy was seen today for medication management.  Diagnoses and all orders for this visit:  Encounter for health maintenance examination in adult -     Comprehensive metabolic panel -     CBC -     Hemoglobin A1c -     PSA -     Lipid panel  Essential hypertension -     losartan-hydrochlorothiazide (HYZAAR) 50-12.5 MG tablet; Take 1 tablet by mouth daily.  LVH (left ventricular hypertrophy)  Impaired fasting blood sugar -     Hemoglobin A1c  Fatty liver -     Comprehensive metabolic panel  Vaccine counseling  Need for influenza vaccination  Hyperlipidemia, unspecified hyperlipidemia type  Family history of premature CAD  Erectile dysfunction, unspecified erectile dysfunction type  Need for COVID-19 vaccine -     Pfizer SARS-COV-2 Vaccine  Screening for prostate cancer -     PSA  Chronic bronchitis, unspecified chronic bronchitis type ([place])  Former smoker  Other orders -     budesonide-formoterol (SYMBICORT) 80-4.5 MCG/ACT inhaler; Inhale 2 puffs into the lungs in the morning and at bedtime. -     rosuvastatin (CRESTOR) 5 MG tablet; Take 1 tablet (5 mg total) by mouth at bedtime. -     albuterol (VENTOLIN HFA) 108 (90 Base) MCG/ACT inhaler; TAKE 2 PUFFS BY MOUTH EVERY 6 HOURS AS NEEDED FOR WHEEZE OR SHORTNESS  OF BREATH -     Flu Vaccine QUAD 6+ mos PF IM (Fluarix Quad PF)     Follow-up pending labs, yearly for physical

## 2020-09-06 NOTE — Patient Instructions (Signed)
If Anoro too expensive, please call insurance to find out their "preferred" options instead of Anoro   Combo inhalers like Anoro that include long acting bronchodilator and long acting steroids or anticholinergic medication:  Anoro  Breo  Symbicort  Dulera  Advair  Beverespi   Long acting steroid only medication:  Flovent  Pulmicort  Qvar  Asmanex

## 2020-09-07 LAB — COMPREHENSIVE METABOLIC PANEL
ALT: 36 IU/L (ref 0–44)
AST: 40 IU/L (ref 0–40)
Albumin/Globulin Ratio: 2 (ref 1.2–2.2)
Albumin: 4.9 g/dL (ref 3.8–4.9)
Alkaline Phosphatase: 102 IU/L (ref 44–121)
BUN/Creatinine Ratio: 23 — ABNORMAL HIGH (ref 9–20)
BUN: 23 mg/dL (ref 6–24)
Bilirubin Total: 0.3 mg/dL (ref 0.0–1.2)
CO2: 20 mmol/L (ref 20–29)
Calcium: 10.1 mg/dL (ref 8.7–10.2)
Chloride: 105 mmol/L (ref 96–106)
Creatinine, Ser: 1 mg/dL (ref 0.76–1.27)
GFR calc Af Amer: 97 mL/min/{1.73_m2} (ref >59)
GFR calc non Af Amer: 84 mL/min/{1.73_m2} (ref >59)
Globulin, Total: 2.4 g/dL (ref 1.5–4.5)
Glucose: 96 mg/dL (ref 65–99)
Potassium: 4.5 mmol/L (ref 3.5–5.2)
Sodium: 142 mmol/L (ref 134–144)
Total Protein: 7.3 g/dL (ref 6.0–8.5)

## 2020-09-07 LAB — PSA: Prostate Specific Ag, Serum: 0.4 ng/mL (ref 0.0–4.0)

## 2020-09-07 LAB — LIPID PANEL
Chol/HDL Ratio: 3.6 ratio (ref 0.0–5.0)
Cholesterol, Total: 189 mg/dL (ref 100–199)
HDL: 53 mg/dL (ref >39)
LDL Chol Calc (NIH): 125 mg/dL — ABNORMAL HIGH (ref 0–99)
Triglycerides: 61 mg/dL (ref 0–149)
VLDL Cholesterol Cal: 11 mg/dL (ref 5–40)

## 2020-09-07 LAB — HEMOGLOBIN A1C
Est. average glucose Bld gHb Est-mCnc: 126 mg/dL
Hgb A1c MFr Bld: 6 % — ABNORMAL HIGH (ref 4.8–5.6)

## 2020-09-07 LAB — CBC
Hematocrit: 41.1 % (ref 37.5–51.0)
Hemoglobin: 13 g/dL (ref 13.0–17.7)
MCH: 27.2 pg (ref 26.6–33.0)
MCHC: 31.6 g/dL (ref 31.5–35.7)
MCV: 86 fL (ref 79–97)
Platelets: 206 10*3/uL (ref 150–450)
RBC: 4.78 x10E6/uL (ref 4.14–5.80)
RDW: 13.9 % (ref 11.6–15.4)
WBC: 4.9 10*3/uL (ref 3.4–10.8)
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
## 2020-12-11 ENCOUNTER — Other Ambulatory Visit: Payer: Self-pay | Admitting: Medical

## 2021-01-05 ENCOUNTER — Other Ambulatory Visit: Payer: Self-pay | Admitting: Medical

## 2021-05-30 ENCOUNTER — Other Ambulatory Visit: Payer: Self-pay | Admitting: Medical

## 2021-06-27 ENCOUNTER — Other Ambulatory Visit: Payer: Self-pay | Admitting: Medical

## 2021-09-26 ENCOUNTER — Other Ambulatory Visit: Payer: Self-pay | Admitting: Medical

## 2021-09-26 DIAGNOSIS — I10 Essential (primary) hypertension: Secondary | ICD-10-CM

## 2021-10-17 ENCOUNTER — Other Ambulatory Visit: Payer: Self-pay | Admitting: Medical

## 2021-10-17 DIAGNOSIS — I10 Essential (primary) hypertension: Secondary | ICD-10-CM

## 2021-10-18 NOTE — Telephone Encounter (Signed)
Left detailed message for pt to call back to schedule an appointment as he is overdue. Once scheduled we can refill for 30 days

## 2021-10-26 ENCOUNTER — Telehealth: Payer: Self-pay

## 2021-10-26 ENCOUNTER — Other Ambulatory Visit: Payer: Self-pay | Admitting: Medical

## 2021-10-26 DIAGNOSIS — I10 Essential (primary) hypertension: Secondary | ICD-10-CM

## 2021-10-26 MED ORDER — ROSUVASTATIN CALCIUM 5 MG PO TABS: 5.0000 mg | ORAL_TABLET | Freq: Every day | ORAL | 0 refills | Status: DC

## 2021-10-26 MED ORDER — BUDESONIDE-FORMOTEROL FUMARATE 80-4.5 MCG/ACT IN AERO: 2.0000 | INHALATION_SPRAY | Freq: Two times a day (BID) | RESPIRATORY_TRACT | 0 refills | Status: DC

## 2021-10-26 MED ORDER — ALBUTEROL SULFATE HFA 108 (90 BASE) MCG/ACT IN AERS
2.0000 | INHALATION_SPRAY | Freq: Four times a day (QID) | RESPIRATORY_TRACT | 0 refills | Status: DC | PRN
Start: 2021-10-26 — End: 2021-11-28

## 2021-10-26 MED ORDER — LOSARTAN POTASSIUM-HCTZ 50-12.5 MG PO TABS: 1.0000 | ORAL_TABLET | Freq: Every day | ORAL | 0 refills | Status: DC

## 2021-10-26 NOTE — Telephone Encounter (Signed)
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  States she will make an appt as soon as he can take any time off.  States he didn't know he needed an appointment because he had never gotten into mychart to get his messages until she did recently.  Said he really needs his BP med and his inhalers.

## 2021-10-29 NOTE — Telephone Encounter (Signed)
Called pt and left a message advising that this will be be last refill. Pt needs an appt before anymore refills. PT advised to call the office and make an appt.

## 2021-11-23 ENCOUNTER — Other Ambulatory Visit: Payer: Self-pay | Admitting: Medical

## 2021-11-23 DIAGNOSIS — I10 Essential (primary) hypertension: Secondary | ICD-10-CM

## 2021-11-28 ENCOUNTER — Encounter: Payer: BC Managed Care – PPO | Admitting: Medical

## 2021-11-28 ENCOUNTER — Other Ambulatory Visit: Payer: Self-pay | Admitting: Medical

## 2021-11-28 NOTE — Telephone Encounter (Signed)
Pt has an appt today.  

## 2021-11-28 NOTE — Telephone Encounter (Signed)
Patient had an appt today and cancelled and move his appt to June. Pt has not been here in over a year. Ok to refill ?

## 2021-12-07 ENCOUNTER — Other Ambulatory Visit: Payer: Self-pay | Admitting: Medical

## 2021-12-07 DIAGNOSIS — I10 Essential (primary) hypertension: Secondary | ICD-10-CM

## 2022-02-27 ENCOUNTER — Ambulatory Visit (INDEPENDENT_AMBULATORY_CARE_PROVIDER_SITE_OTHER): Payer: BC Managed Care – PPO | Admitting: Medical

## 2022-02-27 ENCOUNTER — Encounter: Payer: Self-pay | Admitting: Medical

## 2022-02-27 VITALS — BP 132/78 | HR 60 | Ht 74.0 in | Wt 182.0 lb

## 2022-02-27 DIAGNOSIS — I517 Cardiomegaly: Secondary | ICD-10-CM

## 2022-02-27 DIAGNOSIS — N529 Male erectile dysfunction, unspecified: Secondary | ICD-10-CM | POA: Diagnosis not present

## 2022-02-27 DIAGNOSIS — Z0000 Encounter for general adult medical examination without abnormal findings: Secondary | ICD-10-CM

## 2022-02-27 DIAGNOSIS — R942 Abnormal results of pulmonary function studies: Secondary | ICD-10-CM

## 2022-02-27 DIAGNOSIS — Z7185 Encounter for immunization safety counseling: Secondary | ICD-10-CM

## 2022-02-27 DIAGNOSIS — Z23 Encounter for immunization: Secondary | ICD-10-CM | POA: Diagnosis not present

## 2022-02-27 DIAGNOSIS — R7301 Impaired fasting glucose: Secondary | ICD-10-CM | POA: Diagnosis not present

## 2022-02-27 DIAGNOSIS — Z87891 Personal history of nicotine dependence: Secondary | ICD-10-CM

## 2022-02-27 DIAGNOSIS — J42 Unspecified chronic bronchitis: Secondary | ICD-10-CM | POA: Diagnosis not present

## 2022-02-27 DIAGNOSIS — I10 Essential (primary) hypertension: Secondary | ICD-10-CM

## 2022-02-27 DIAGNOSIS — Z125 Encounter for screening for malignant neoplasm of prostate: Secondary | ICD-10-CM

## 2022-02-27 DIAGNOSIS — Z8249 Family history of ischemic heart disease and other diseases of the circulatory system: Secondary | ICD-10-CM

## 2022-02-27 DIAGNOSIS — Z86010 Personal history of colonic polyps: Secondary | ICD-10-CM

## 2022-02-27 DIAGNOSIS — E785 Hyperlipidemia, unspecified: Secondary | ICD-10-CM | POA: Diagnosis not present

## 2022-02-27 DIAGNOSIS — K760 Fatty (change of) liver, not elsewhere classified: Secondary | ICD-10-CM

## 2022-02-27 NOTE — Progress Notes (Signed)
Subjective:   HPI  Calvin Reid is a 56 y.o. male who presents for Chief Complaint  Patient presents with   fasting cpe    Fasting cpe, no concerns    Patient Care Team: Leeman Johnsey, Leward Quan as PCP - General (Family Medicine) [place], Peter M, MD as Consulting Physician (Cardiology) Ladene Artist, MD as Consulting Physician (Gastroenterology) Sees dentist Sees eye doctor  Concerns: Hypertension-compliant with medication without complaint.   Checks BPs occasionally, normal.  Hyperlipidemia- compliant with cresotr without c/o  COPD-uses Symbicort on average 2 days per week, rarely has to use albuterol.  Former smoker.  Quit 2-3 years ago.   Reviewed their medical, surgical, family, social, medication, and allergy history and updated chart as appropriate.  Past Medical History:  Diagnosis Date   Chronic bronchitis ([place])    Elevated LFTs 07/2016   Former smoker    quit 04/2016   H/O cardiovascular stress test 03/08/2009   stress echo normal.   Dr. Peter Martinique   Hyperlipidemia    Hypertension    Impaired fasting blood sugar    Wears partial dentures     Past Surgical History:  Procedure Laterality Date   COLONOSCOPY  06/11/2016   repeat 2027.   Dr. Kennedy Bucker, colon polyps   WISDOM TOOTH EXTRACTION      Family History  Problem Relation Age of Onset   Hypertension Mother    COPD Father        died in his 44s   Heart disease Sister 61       CAD   Heart disease Son        arrhythmia   Heart disease Son    Diabetes Brother    Stroke Neg Hx    Cancer Neg Hx    Colon cancer Neg Hx      Current Outpatient Medications:    albuterol (VENTOLIN HFA) 108 (90 Base) MCG/ACT inhaler, TAKE 2 PUFFS BY MOUTH EVERY 6 HOURS AS NEEDED FOR WHEEZE OR SHORTNESS OF BREATH, Disp: 8.5 each, Rfl: 0   ASPIRIN LOW DOSE 81 MG EC tablet, TAKE 1 TABLET BY MOUTH EVERY DAY, Disp: 90 tablet, Rfl: 3   losartan-hydrochlorothiazide (HYZAAR) 50-12.5 MG tablet, TAKE 1 TABLET BY MOUTH  EVERY DAY, Disp: 90 tablet, Rfl: 0   rosuvastatin (CRESTOR) 5 MG tablet, TAKE 1 TABLET BY MOUTH EVERYDAY AT BEDTIME, Disp: 90 tablet, Rfl: 0   sildenafil (VIAGRA) 100 MG tablet, 1/2-1 tablet po daily prn, Disp: 30 tablet, Rfl: 5   SYMBICORT 80-4.5 MCG/ACT inhaler, INHALE 2 PUFFS INTO THE LUNGS IN THE MORNING AND AT BEDTIME., Disp: 10.2 each, Rfl: 2  No Known Allergies     Review of Systems Constitutional: -fever, -chills, -sweats, -unexpected weight change, -decreased appetite, -fatigue Allergy: -sneezing, -itching, -congestion Dermatology: -changing moles, --rash, -lumps ENT: -runny nose, -ear pain, -sore throat, -hoarseness, -sinus pain, -teeth pain, - ringing in ears, -hearing loss, -nosebleeds Cardiology: -chest pain, -palpitations, -swelling, -difficulty breathing when lying flat, -waking up short of breath Respiratory: -cough, -shortness of breath, -difficulty breathing with exercise or exertion, -wheezing, -coughing up blood Gastroenterology: -abdominal pain, -nausea, -vomiting, -diarrhea, -constipation, -blood in stool, -changes in bowel movement, -difficulty swallowing or eating Hematology: -bleeding, -bruising  Musculoskeletal: -joint aches, -muscle aches, -joint swelling, -back pain, -neck pain, -cramping, -changes in gait Ophthalmology: denies vision changes, eye redness, itching, discharge Urology: -burning with urination, -difficulty urinating, -blood in urine, -urinary frequency, -urgency, -incontinence Neurology: -headache, -weakness, -tingling, -numbness, -memory loss, -falls, -dizziness  Psychology: -depressed mood, -agitation, -sleep problems Male GU: no testicular mass, pain, no lymph nodes swollen, no swelling, no rash.     Objective:  BP 132/78   Pulse 60   Ht '6\' 2"'$  (1.88 m)   Wt 182 lb (82.6 kg)   BMI 23.37 kg/m   General appearance: alert, no distress, WD/WN, African American male Skin: unremarkable HEENT: normocephalic, conjunctiva/corneas normal, sclerae  anicteric, PERRLA, EOMi, nares patent, no discharge or erythema, pharynx normal Oral cavity: MMM, tongue normal, teeth normal Neck: supple, no lymphadenopathy, no thyromegaly, no masses, normal ROM, no bruits Chest: non tender, normal shape and expansion Heart: RRR, normal S1, S2, no murmurs Lungs: CTA bilaterally, no wheezes, rhonchi, or rales Abdomen: +bs, soft, non tender, non distended, no masses, no hepatomegaly, no splenomegaly, no bruits Back: non tender, normal ROM, no scoliosis Musculoskeletal: upper extremities non tender, no obvious deformity, normal ROM throughout, lower extremities non tender, no obvious deformity, normal ROM throughout Extremities: no edema, no cyanosis, no clubbing Pulses: 2+ symmetric, upper and lower extremities, normal cap refill Neurological: alert, oriented x 3, CN2-12 intact, strength normal upper extremities and lower extremities, sensation normal throughout, DTRs 2+ throughout, no cerebellar signs, gait normal Psychiatric: normal affect, behavior normal, pleasant  GU: Normal male, circumcised, no mass or hernia, no lymphadenopathy rectal-prostate mildly enlarged without nodules, anus normal tone   PFT reviewed   Assessment and Plan :   Encounter Diagnoses  Name Primary?   Encounter for health maintenance examination in adult Yes   Chronic bronchitis, unspecified chronic bronchitis type ([place])    Abnormal PFT    Erectile dysfunction, unspecified erectile dysfunction type    Essential hypertension    Family history of premature CAD    Fatty liver    Former smoker    History of colonic polyps    Hyperlipidemia, unspecified hyperlipidemia type    Impaired fasting blood sugar    LVH (left ventricular hypertrophy)    Screening for prostate cancer    Vaccine counseling    Need for Td vaccine    Need for pneumococcal 20-valent conjugate vaccination     Today you had a preventative care visit or wellness visit.    Topics today may have included  healthy lifestyle, diet, exercise, preventative care, vaccinations, sick and well care, proper use of emergency dept and after hours care, as well as other concerns.     Recommendations: Continue to return yearly for your annual wellness and preventative care visits.  This gives [place] a chance to discuss healthy lifestyle, exercise, vaccinations, review your chart record, and perform screenings where appropriate.  I recommend you see your eye doctor yearly for routine vision care.  I recommend you see your dentist yearly for routine dental care including hygiene visits twice yearly.   Vaccination  Immunization History  Administered Date(s) Administered   Influenza,inj,Quad PF,6+ Mos 05/26/2017, 07/24/2018, 08/04/2019, 09/06/2020   PFIZER(Purple Top)SARS-COV-2 Vaccination 11/12/2019, 12/03/2019, 09/06/2020   PNEUMOCOCCAL CONJUGATE-20 02/27/2022   Pneumococcal Polysaccharide-23 04/10/2016   Td 02/27/2022   Tdap 02/15/2009   Zoster Recombinat (Shingrix) 09/11/2017, 11/14/2017   Counseled on the Td (tetanus, diptheria) vaccine.  Vaccine information sheet given. Td vaccine given after consent obtained.  Counseled on the pneumococcal vaccine.  Vaccine information sheet given.  Pneumococcal vaccine Prevnar 20 given after consent obtained.   Screening for cancer: Colon cancer screening:  I reviewed your colonoscopy on file that is up to date  We discussed PSA, prostate exam, and prostate cancer screening risks/benefits.  Skin cancer screening: Check your skin regularly for new changes, growing lesions, or other lesions of concern Come in for evaluation if you have skin lesions of concern.  Lung cancer screening: If you have a greater than 30 pack year history of tobacco use, then you qualify for lung cancer screening with a chest CT scan  We currently don't have screenings for other cancers besides breast, cervical, colon, and lung cancers.  If you have a strong family history of  cancer or have other cancer screening concerns, please let me know.    Bone health: Get at least 150 minutes of aerobic exercise weekly Get weight bearing exercise at least once weekly    Heart health: Get at least 150 minutes of aerobic exercise weekly Limit alcohol It is important to maintain a healthy blood pressure and healthy cholesterol numbers    Separate significant issues discussed: Hypertension-continue current medication, advised home blood pressure monitoring  Hyperlipidemia-continue statin, labs today  COPD-reviewed updated PFT today.  Will set up for chest CT.  Continue Symbicort for now.    Continue albuterol as needed  Counseled on need for healthy diet regular exercise  Fatty liver disease-continue efforts with low-fat diet, reviewed the ultrasound from January 2020 showing fatty liver  Impaired glucose-updated labs today   Carmel was seen today for fasting cpe.  Diagnoses and all orders for this visit:  Encounter for health maintenance examination in adult -     CT Chest W Contrast; Future -     Comprehensive metabolic panel -     CBC with Differential/Platelet -     Lipid panel -     Hemoglobin A1c -     PSA -     Urinalysis  Chronic bronchitis, unspecified chronic bronchitis type ([facility]) -     CT Chest W Contrast; Future -     Spirometry with graph  Abnormal PFT -     CT Chest W Contrast; Future  Erectile dysfunction, unspecified erectile dysfunction type  Essential hypertension  Family history of premature CAD  Fatty liver  Former smoker  History of colonic polyps  Hyperlipidemia, unspecified hyperlipidemia type -     Lipid panel  Impaired fasting blood sugar -     Hemoglobin A1c  LVH (left ventricular hypertrophy)  Screening for prostate cancer -     PSA  Vaccine counseling  Need for Td vaccine -     Td vaccine greater than or equal to 7yo preservative free IM  Need for pneumococcal 20-valent conjugate vaccination -      Pneumococcal conjugate vaccine 20-valent    Follow-up pending labs, yearly for physical

## 2022-02-28 ENCOUNTER — Other Ambulatory Visit: Payer: Self-pay | Admitting: Medical

## 2022-02-28 DIAGNOSIS — I10 Essential (primary) hypertension: Secondary | ICD-10-CM

## 2022-02-28 LAB — URINALYSIS
Bilirubin, UA: NEGATIVE
Glucose, UA: NEGATIVE
Ketones, UA: NEGATIVE
Leukocytes,UA: NEGATIVE
Nitrite, UA: NEGATIVE
Protein,UA: NEGATIVE
RBC, UA: NEGATIVE
Specific Gravity, UA: 1.016 (ref 1.005–1.030)
Urobilinogen, Ur: 0.2 mg/dL (ref 0.2–1.0)
pH, UA: 5.5 (ref 5.0–7.5)

## 2022-02-28 LAB — CBC WITH DIFFERENTIAL/PLATELET
Basophils Absolute: 0 10*3/uL (ref 0.0–0.2)
Basos: 0 %
EOS (ABSOLUTE): 0.1 10*3/uL (ref 0.0–0.4)
Eos: 3 %
Hematocrit: 41.9 % (ref 37.5–51.0)
Hemoglobin: 13.6 g/dL (ref 13.0–17.7)
Immature Grans (Abs): 0 10*3/uL (ref 0.0–0.1)
Immature Granulocytes: 0 %
Lymphocytes Absolute: 1.4 10*3/uL (ref 0.7–3.1)
Lymphs: 45 %
MCH: 28.6 pg (ref 26.6–33.0)
MCHC: 32.5 g/dL (ref 31.5–35.7)
MCV: 88 fL (ref 79–97)
Monocytes Absolute: 0.3 10*3/uL (ref 0.1–0.9)
Monocytes: 10 %
Neutrophils Absolute: 1.4 10*3/uL (ref 1.4–7.0)
Neutrophils: 42 %
Platelets: 214 10*3/uL (ref 150–450)
RBC: 4.75 x10E6/uL (ref 4.14–5.80)
RDW: 13.6 % (ref 11.6–15.4)
WBC: 3.2 10*3/uL — ABNORMAL LOW (ref 3.4–10.8)

## 2022-02-28 LAB — COMPREHENSIVE METABOLIC PANEL
ALT: 36 IU/L (ref 0–44)
AST: 25 IU/L (ref 0–40)
Albumin/Globulin Ratio: 2 (ref 1.2–2.2)
Albumin: 4.7 g/dL (ref 3.8–4.9)
Alkaline Phosphatase: 96 IU/L (ref 44–121)
BUN/Creatinine Ratio: 14 (ref 9–20)
BUN: 17 mg/dL (ref 6–24)
Bilirubin Total: 0.5 mg/dL (ref 0.0–1.2)
CO2: 22 mmol/L (ref 20–29)
Calcium: 9.8 mg/dL (ref 8.7–10.2)
Chloride: 105 mmol/L (ref 96–106)
Creatinine, Ser: 1.21 mg/dL (ref 0.76–1.27)
Globulin, Total: 2.4 g/dL (ref 1.5–4.5)
Glucose: 98 mg/dL (ref 70–99)
Potassium: 4.5 mmol/L (ref 3.5–5.2)
Sodium: 139 mmol/L (ref 134–144)
Total Protein: 7.1 g/dL (ref 6.0–8.5)
eGFR: 70 mL/min/{1.73_m2} (ref >59)

## 2022-02-28 LAB — LIPID PANEL
Chol/HDL Ratio: 3.1 ratio (ref 0.0–5.0)
Cholesterol, Total: 163 mg/dL (ref 100–199)
HDL: 53 mg/dL (ref >39)
LDL Chol Calc (NIH): 98 mg/dL (ref 0–99)
Triglycerides: 61 mg/dL (ref 0–149)
VLDL Cholesterol Cal: 12 mg/dL (ref 5–40)

## 2022-02-28 LAB — HEMOGLOBIN A1C
Est. average glucose Bld gHb Est-mCnc: 123 mg/dL
Hgb A1c MFr Bld: 5.9 % — ABNORMAL HIGH (ref 4.8–5.6)

## 2022-02-28 LAB — PSA: Prostate Specific Ag, Serum: 0.5 ng/mL (ref 0.0–4.0)

## 2022-02-28 MED ORDER — ALBUTEROL SULFATE HFA 108 (90 BASE) MCG/ACT IN AERS: INHALATION_SPRAY | RESPIRATORY_TRACT | 1 refills | Status: DC

## 2022-02-28 MED ORDER — LOSARTAN POTASSIUM-HCTZ 50-12.5 MG PO TABS: 1.0000 | ORAL_TABLET | Freq: Every day | ORAL | 3 refills | Status: DC

## 2022-02-28 MED ORDER — ROSUVASTATIN CALCIUM 5 MG PO TABS: ORAL_TABLET | ORAL | 3 refills | Status: DC

## 2022-02-28 MED ORDER — BUDESONIDE-FORMOTEROL FUMARATE 80-4.5 MCG/ACT IN AERO: 2.0000 | INHALATION_SPRAY | Freq: Two times a day (BID) | RESPIRATORY_TRACT | 5 refills | Status: DC

## 2022-02-28 MED ORDER — ASPIRIN 81 MG PO TBEC: 81.0000 mg | DELAYED_RELEASE_TABLET | Freq: Every day | ORAL | 3 refills | Status: DC

## 2022-03-29 ENCOUNTER — Ambulatory Visit
Admission: RE | Admit: 2022-03-29 | Discharge: 2022-03-29 | Disposition: A | Discharge status: Home / Self Care | Payer: BC Managed Care – PPO | Source: Ambulatory Visit | Attending: Medical | Admitting: Medical

## 2022-03-29 DIAGNOSIS — R918 Other nonspecific abnormal finding of lung field: Secondary | ICD-10-CM | POA: Diagnosis not present

## 2022-03-29 DIAGNOSIS — J42 Unspecified chronic bronchitis: Secondary | ICD-10-CM

## 2022-03-29 DIAGNOSIS — Z0000 Encounter for general adult medical examination without abnormal findings: Secondary | ICD-10-CM

## 2022-03-29 DIAGNOSIS — R942 Abnormal results of pulmonary function studies: Secondary | ICD-10-CM

## 2022-03-29 DIAGNOSIS — I2510 Atherosclerotic heart disease of native coronary artery without angina pectoris: Secondary | ICD-10-CM | POA: Diagnosis not present

## 2022-03-29 DIAGNOSIS — I77811 Abdominal aortic ectasia: Secondary | ICD-10-CM | POA: Diagnosis not present

## 2022-03-29 DIAGNOSIS — I700 Atherosclerosis of aorta: Secondary | ICD-10-CM | POA: Diagnosis not present

## 2022-03-29 MED ORDER — IOPAMIDOL (ISOVUE-300) INJECTION 61%
75.0000 mL | Freq: Once | INTRAVENOUS | Status: AC | PRN
  Administered 2022-03-29: 75 mL via INTRAVENOUS

## 2022-04-02 ENCOUNTER — Other Ambulatory Visit: Payer: Self-pay | Admitting: Medical

## 2022-04-02 DIAGNOSIS — K769 Liver disease, unspecified: Secondary | ICD-10-CM

## 2022-04-02 MED ORDER — ROSUVASTATIN CALCIUM 10 MG PO TABS: 10.0000 mg | ORAL_TABLET | Freq: Every day | ORAL | 3 refills | Status: DC

## 2022-04-13 ENCOUNTER — Ambulatory Visit
Admission: RE | Admit: 2022-04-13 | Discharge: 2022-04-13 | Disposition: A | Discharge status: Home / Self Care | Payer: BC Managed Care – PPO | Source: Ambulatory Visit | Attending: Medical | Admitting: Medical

## 2022-04-13 DIAGNOSIS — K7689 Other specified diseases of liver: Secondary | ICD-10-CM | POA: Diagnosis not present

## 2022-04-13 DIAGNOSIS — K769 Liver disease, unspecified: Secondary | ICD-10-CM

## 2022-04-13 MED ORDER — GADOBENATE DIMEGLUMINE 529 MG/ML IV SOLN
17.0000 mL | Freq: Once | INTRAVENOUS | Status: AC | PRN
  Administered 2022-04-13: 17 mL via INTRAVENOUS

## 2022-04-15 ENCOUNTER — Telehealth: Payer: Self-pay | Admitting: Medical

## 2022-04-15 NOTE — Telephone Encounter (Signed)
Pt's wife called they lost both of their sons last week and funerals this week and she wanted to know if he could get something to help him   Please call and advise either way

## 2022-04-16 ENCOUNTER — Other Ambulatory Visit: Payer: Self-pay | Admitting: Medical

## 2022-04-16 MED ORDER — ALPRAZOLAM 0.5 MG PO TABS: 0.5000 mg | ORAL_TABLET | Freq: Two times a day (BID) | ORAL | 0 refills | Status: DC | PRN

## 2022-05-08 ENCOUNTER — Encounter: Payer: Self-pay | Admitting: Internal Medicine

## 2022-07-19 ENCOUNTER — Other Ambulatory Visit: Payer: Self-pay | Admitting: Medical

## 2023-03-04 ENCOUNTER — Encounter: Payer: Self-pay | Admitting: Medical

## 2023-03-04 ENCOUNTER — Ambulatory Visit (INDEPENDENT_AMBULATORY_CARE_PROVIDER_SITE_OTHER): Payer: BC Managed Care – PPO | Admitting: Medical

## 2023-03-04 VITALS — BP 160/90 | HR 60 | Ht 72.56 in | Wt 178.0 lb

## 2023-03-04 DIAGNOSIS — I700 Atherosclerosis of aorta: Secondary | ICD-10-CM | POA: Diagnosis not present

## 2023-03-04 DIAGNOSIS — Z1211 Encounter for screening for malignant neoplasm of colon: Secondary | ICD-10-CM | POA: Diagnosis not present

## 2023-03-04 DIAGNOSIS — Z0000 Encounter for general adult medical examination without abnormal findings: Secondary | ICD-10-CM

## 2023-03-04 DIAGNOSIS — I10 Essential (primary) hypertension: Secondary | ICD-10-CM

## 2023-03-04 DIAGNOSIS — Z972 Presence of dental prosthetic device (complete) (partial): Secondary | ICD-10-CM

## 2023-03-04 DIAGNOSIS — N529 Male erectile dysfunction, unspecified: Secondary | ICD-10-CM

## 2023-03-04 DIAGNOSIS — R7301 Impaired fasting glucose: Secondary | ICD-10-CM

## 2023-03-04 DIAGNOSIS — Z125 Encounter for screening for malignant neoplasm of prostate: Secondary | ICD-10-CM

## 2023-03-04 DIAGNOSIS — E785 Hyperlipidemia, unspecified: Secondary | ICD-10-CM | POA: Diagnosis not present

## 2023-03-04 DIAGNOSIS — J4540 Moderate persistent asthma, uncomplicated: Secondary | ICD-10-CM

## 2023-03-04 LAB — POCT URINALYSIS DIP (PROADVANTAGE DEVICE)
Blood, UA: NEGATIVE
Glucose, UA: NEGATIVE mg/dL
Leukocytes, UA: NEGATIVE
Nitrite, UA: NEGATIVE
Protein Ur, POC: NEGATIVE mg/dL
Specific Gravity, Urine: 1.03
Urobilinogen, Ur: 0.2
pH, UA: 6 (ref 5.0–8.0)

## 2023-03-04 LAB — LIPID PANEL

## 2023-03-04 LAB — CMP14+EGFR

## 2023-03-04 NOTE — Progress Notes (Signed)
Subjective:   HPI  Calvin Reid is a 57 y.o. male who presents for Chief Complaint  Patient presents with   Annual Exam    Fasting annual exam, no new concerns. Will give UA on the way out.     Patient Care Team: Semaje Kinker, Cleda Mccreedy as PCP - General (Family Medicine) [place], Peter M, MD as Consulting Physician (Cardiology) Meryl Dare, MD as Consulting Physician (Gastroenterology) Sees dentist and eye doctor yearly  Concerns: HTN - compliant with medication.   Last week at dentist BP was good. He notes good readings at home.  No chest pain, no dyspnea, no swelling.  Hyperlipidemia - compliant with Crestor and aspirin, but uses aspirin every other day.    Physical activity on the jobs, works in [employer], museum/gallery.   Former smoker, quit smoking 2+ years ago.   Reviewed their medical, surgical, family, social, medication, and allergy history and updated chart as appropriate.  No Known Allergies  Past Medical History:  Diagnosis Date   Chronic bronchitis (HCC)    Elevated LFTs 07/2016   Former smoker    quit 04/2016   H/O cardiovascular stress test 03/08/2009   stress echo normal.   Dr. Peter [place]   Hyperlipidemia    Hypertension    Impaired fasting blood sugar    Wears partial dentures     Current Outpatient Medications on File Prior to Visit  Medication Sig Dispense Refill   aspirin EC (ASPIRIN LOW DOSE) 81 MG tablet Take 1 tablet (81 mg total) by mouth daily. Swallow whole. (Patient taking differently: Take 81 mg by mouth every other day. Swallow whole.) 90 tablet 3   budesonide-formoterol (SYMBICORT) 80-4.5 MCG/ACT inhaler Inhale 2 puffs into the lungs 2 (two) times daily. in the morning and at bedtime. 10.2 each 5   losartan-hydrochlorothiazide (HYZAAR) 50-12.5 MG tablet Take 1 tablet by mouth daily. 90 tablet 3   rosuvastatin (CRESTOR) 10 MG tablet Take 1 tablet (10 mg total) by mouth daily. 90 tablet 3   albuterol (VENTOLIN HFA) 108 (90 Base) MCG/ACT  inhaler TAKE 2 PUFFS BY MOUTH EVERY 6 HOURS AS NEEDED FOR WHEEZE OR SHORTNESS OF BREATH (Patient not taking: Reported on 03/04/2023) 8.5 each 1   ALPRAZolam (XANAX) 0.5 MG tablet Take 1 tablet (0.5 mg total) by mouth 2 (two) times daily as needed for sleep or anxiety. (Patient not taking: Reported on 03/04/2023) 30 tablet 0   sildenafil (VIAGRA) 100 MG tablet 1/2-1 tablet po daily prn (Patient not taking: Reported on 03/04/2023) 30 tablet 5   No current facility-administered medications on file prior to visit.      Current Outpatient Medications:    aspirin EC (ASPIRIN LOW DOSE) 81 MG tablet, Take 1 tablet (81 mg total) by mouth daily. Swallow whole. (Patient taking differently: Take 81 mg by mouth every other day. Swallow whole.), Disp: 90 tablet, Rfl: 3   budesonide-formoterol (SYMBICORT) 80-4.5 MCG/ACT inhaler, Inhale 2 puffs into the lungs 2 (two) times daily. in the morning and at bedtime., Disp: 10.2 each, Rfl: 5   losartan-hydrochlorothiazide (HYZAAR) 50-12.5 MG tablet, Take 1 tablet by mouth daily., Disp: 90 tablet, Rfl: 3   rosuvastatin (CRESTOR) 10 MG tablet, Take 1 tablet (10 mg total) by mouth daily., Disp: 90 tablet, Rfl: 3   albuterol (VENTOLIN HFA) 108 (90 Base) MCG/ACT inhaler, TAKE 2 PUFFS BY MOUTH EVERY 6 HOURS AS NEEDED FOR WHEEZE OR SHORTNESS OF BREATH (Patient not taking: Reported on 03/04/2023), Disp: 8.5 each, Rfl: 1  ALPRAZolam (XANAX) 0.5 MG tablet, Take 1 tablet (0.5 mg total) by mouth 2 (two) times daily as needed for sleep or anxiety. (Patient not taking: Reported on 03/04/2023), Disp: 30 tablet, Rfl: 0   sildenafil (VIAGRA) 100 MG tablet, 1/2-1 tablet po daily prn (Patient not taking: Reported on 03/04/2023), Disp: 30 tablet, Rfl: 5  Family History  Problem Relation Age of Onset   Hypertension Mother    COPD Father        died in his 45s   Heart disease Sister 43       CAD   Heart disease Son        arrhythmia   Heart disease Son    Diabetes Brother    Stroke Neg Hx     Cancer Neg Hx    Colon cancer Neg Hx     Past Surgical History:  Procedure Laterality Date   COLONOSCOPY  06/11/2016   repeat 2027.   Dr. Karolee Ohs, colon polyps   WISDOM TOOTH EXTRACTION      Review of Systems  Constitutional:  Negative for chills, fever, malaise/fatigue and weight loss.  HENT:  Negative for congestion, ear pain, hearing loss, sore throat and tinnitus.   Eyes:  Negative for blurred vision, pain and redness.  Respiratory:  Negative for cough, hemoptysis and shortness of breath.   Cardiovascular:  Negative for chest pain, palpitations, orthopnea, claudication and leg swelling.  Gastrointestinal:  Negative for abdominal pain, blood in stool, constipation, diarrhea, nausea and vomiting.  Genitourinary:  Negative for dysuria, flank pain, frequency, hematuria and urgency.  Musculoskeletal:  Negative for falls, joint pain and myalgias.  Skin:  Negative for itching and rash.  Neurological:  Negative for dizziness, tingling, speech change, weakness and headaches.  Endo/Heme/Allergies:  Negative for polydipsia. Does not bruise/bleed easily.  Psychiatric/Behavioral:  Negative for depression and memory loss. The patient is not nervous/anxious and does not have insomnia.       Objective:  BP (!) 170/90 (BP Location: Left Arm, Cuff Size: Normal)   Pulse 60   Ht 6' 0.56" (1.843 m)   Wt 178 lb (80.7 kg)   BMI 23.77 kg/m   Wt Readings from Last 3 Encounters:  03/04/23 178 lb (80.7 kg)  02/27/22 182 lb (82.6 kg)  09/06/20 192 lb 3.2 oz (87.2 kg)   BP Readings from Last 3 Encounters:  03/04/23 (!) 170/90  02/27/22 132/78  09/06/20 128/82    General appearance: alert, no distress, WD/WN, African American male Skin: unremarkable HEENT: normocephalic, conjunctiva/corneas normal, sclerae anicteric, PERRLA, EOMi, nares patent, no discharge or erythema, pharynx normal Oral cavity: MMM, tongue normal, teeth - dentures present Neck: supple, no lymphadenopathy, no  thyromegaly, no masses, normal ROM, no bruits Chest: non tender, normal shape and expansion Heart: RRR, normal S1, S2, no murmurs Lungs: CTA bilaterally, no wheezes, rhonchi, or rales Abdomen: +bs, soft, non tender, non distended, no masses, no hepatomegaly, no splenomegaly, no bruits Back: non tender, normal ROM, no scoliosis Musculoskeletal: upper extremities non tender, no obvious deformity, normal ROM throughout, lower extremities non tender, no obvious deformity, normal ROM throughout Extremities: no edema, no cyanosis, no clubbing Pulses: 2+ symmetric, upper and lower extremities, normal cap refill Neurological: alert, oriented x 3, CN2-12 intact, strength normal upper extremities and lower extremities, sensation normal throughout, DTRs 2+ throughout, no cerebellar signs, gait normal Psychiatric: normal affect, behavior normal, pleasant  GU/rectal - deferred    Assessment and Plan :   Encounter Diagnoses  Name Primary?  Encounter for health maintenance examination in adult Yes   Essential hypertension    Screening for prostate cancer    Hyperlipidemia, unspecified hyperlipidemia type    Impaired fasting blood sugar    Aortic atherosclerosis (HCC)    Screen for colon cancer    Wears dentures    Erectile dysfunction, unspecified erectile dysfunction type    Moderate persistent asthma, unspecified whether complicated     This visit was a preventative care visit, also known as wellness visit or routine physical.   Topics typically include healthy lifestyle, diet, exercise, preventative care, vaccinations, sick and well care, proper use of emergency dept and after hours care, as well as other concerns.     Separate significant issues discussed: Hyper lipidemia-compliant with statin, updated labs today, continue current medication, statin plus aspirin daily  Hypertension-he notes compliance to medication, does monitor blood pressures some.  Initially at our office today his  blood pressure was not at goal although he notes home blood pressure readings within normal limits. Advised he monitor BP and get me home readings within 2 weeks  Aortic atherosclerosis-continue aspirin and statin for primary prevention  Asthma, former smoker-doing okay on current therapy with Symbicort daily and albuterol as needed  General Recommendations: Continue to return yearly for your annual wellness and preventative care visits.  This gives [place] a chance to discuss healthy lifestyle, exercise, vaccinations, review your chart record, and perform screenings where appropriate.  I recommend you see your eye doctor yearly for routine vision care.  I recommend you see your dentist yearly for routine dental care including hygiene visits twice yearly.   Vaccination  Immunization History  Administered Date(s) Administered   Influenza,inj,Quad PF,6+ Mos 05/26/2017, 07/24/2018, 08/04/2019, 09/06/2020   Influenza-Unspecified 06/02/2022   PFIZER(Purple Top)SARS-COV-2 Vaccination 11/12/2019, 12/03/2019, 09/06/2020   PNEUMOCOCCAL CONJUGATE-20 02/27/2022   Pneumococcal Polysaccharide-23 04/10/2016   Td 02/27/2022   Tdap 02/15/2009   Zoster Recombinant(Shingrix) 09/11/2017, 11/14/2017    Screening for cancer: Colon cancer screening: We will refer you for screening colonoscopy  Prior or last colon cancer screen: 2017  Testicular cancer screening You should do a monthly self testicular exam if you are between 54-86 years old, and we typically do a testicular exam on the yearly physical for this same age group.   Prostate Cancer screening: The recommended prostate cancer screening test is a blood test called the prostate-specific antigen (PSA) test. PSA is a protein that is made in the prostate. As you age, your prostate naturally produces more PSA. Abnormally high PSA levels may be caused by: Prostate cancer. An enlarged prostate that is not caused by cancer (benign prostatic  hyperplasia, or BPH). This condition is very common in older men. A prostate gland infection (prostatitis) or urinary tract infection. Certain medicines such as male hormones (like testosterone) or other medicines that raise testosterone levels. A rectal exam may be done as part of prostate cancer screening to help provide information about the size of your prostate gland. When a rectal exam is performed, it should be done after the PSA level is drawn to avoid any effect on the results.   Skin cancer screening: Check your skin regularly for new changes, growing lesions, or other lesions of concern Come in for evaluation if you have skin lesions of concern.   Lung cancer screening: If you have a greater than 20 pack year history of tobacco use, then you may qualify for lung cancer screening with a chest CT scan.   Please call your  insurance company to inquire about coverage for this test.   Pancreatic cancer:  no current screening test is available or routinely recommended. 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  Symptoms: jaundice, dark urine, light color or greasy stools, itchy skin, belly or back pain, weight loss, poor appetite, nausea, vomiting, liver enlargement, DVT/blood clots.   We currently don't have screenings for other cancers besides breast, cervical, colon, and lung cancers.  If you have a strong family history of cancer or have other cancer screening concerns, please let me know.  Genetic testing referral is an option for individuals with high cancer risk in the family.  There are some other cancer screenings in development currently.   Bone health: Get at least 150 minutes of aerobic exercise weekly Get weight bearing exercise at least once weekly Bone density test:  A bone density test is an  imaging test that uses a type of X-ray to measure the amount of calcium and other minerals in your bones. The test may be used to diagnose or screen you for a condition that causes weak or thin bones (osteoporosis), predict your risk for a broken bone (fracture), or determine how well your osteoporosis treatment is working. The bone density test is recommended for females 65 and older, or females or males <65 if certain risk factors such as thyroid disease, long term use of steroids such as for asthma or rheumatological issues, vitamin D deficiency, estrogen deficiency, family history of osteoporosis, self or family history of fragility fracture in first degree relative.    Heart health: Get at least 150 minutes of aerobic exercise weekly Limit alcohol It is important to maintain a healthy blood pressure and healthy cholesterol numbers  Heart disease screening: Screening for heart disease includes screening for blood pressure, fasting lipids, glucose/diabetes screening, BMI height to weight ratio, reviewed of smoking status, physical activity, and diet.    Goals include blood pressure 120/80 or less, maintaining a healthy lipid/cholesterol profile, preventing diabetes or keeping diabetes numbers under good control, not smoking or using tobacco products, exercising most days per week or at least 150 minutes per week of exercise, and eating healthy variety of fruits and vegetables, healthy oils, and avoiding unhealthy food choices like fried food, fast food, high sugar and high cholesterol foods.    Other tests may possibly include EKG test, CT coronary calcium score, echocardiogram, exercise treadmill stress test.     September 2017 echocardiogram shows some mild LVH.   Vascular disease screening: For higher risk individuals including smokers, diabetics, patients with known heart disease or high blood pressure, kidney disease, and others, screening for vascular disease or atherosclerosis of the  arteries is available.  Examples may include carotid ultrasound, abdominal aortic ultrasound, ABI blood flow screening in the legs, thoracic aorta screening.   Medical care options: I recommend you continue to seek care here first for routine care.  We try really hard to have available appointments Monday through Friday daytime hours for sick visits, acute visits, and physicals.  Urgent care should be used for after hours and weekends for significant issues that cannot wait till the next day.  The emergency department should be used for significant potentially life-threatening emergencies.  The emergency department is expensive, can often have long wait times for less significant concerns, so try to utilize primary care, urgent care, or telemedicine when possible to  avoid unnecessary trips to the emergency department.  Virtual visits and telemedicine have been introduced since the pandemic started in 2020, and can be convenient ways to receive medical care.  We offer virtual appointments as well to assist you in a variety of options to seek medical care.   Legal  Take the time to do a last will and testament, Advanced Directives including Health Care Power of Attorney and Living Will documents.  Don't leave your family with burdens that can be handled ahead of time.   Advanced Directives: I recommend you consider completing a Health Care Power of Attorney and Living Will.   These documents respect your wishes and help alleviate burdens on your loved ones if you were to become terminally ill or be in a position to need those documents enforced.    You can complete Advanced Directives yourself, have them notarized, then have copies made for our office, for you and for anybody you feel should have them in safe keeping.  Or, you can have an attorney prepare these documents.   If you haven't updated your Last Will and Testament in a while, it may be worthwhile having an attorney prepare these documents  together and save on some costs.       Spiritual and Emotional Health Keeping a healthy spiritual life can help you better manage your physical health. Your spiritual life can help you to cope with any issues that may arise with your physical health.  Balance can keep [place] healthy and help [place] to recover.  If you are struggling with your spiritual health there are questions that you may want to ask yourself:  What makes me feel most complete? When do I feel most connected to the rest of the world? Where do I find the most inner strength? What am I doing when I feel whole?  Helpful tips: Being in nature. Some people feel very connected and at peace when they are walking outdoors or are outside. Helping others. Some feel the largest sense of wellbeing when they are of service to others. Being of service can take on many forms. It can be doing volunteer work, being kind to strangers, or offering a hand to a friend in need. Gratitude. Some people find they feel the most connected when they remain grateful. They may make lists of all the things they are grateful for or say a thank you out loud for all they have.    Emotional Health Are you in tune with your emotional health?  Check out this link: http://www.marquez-love.com/    Financial Health Make sure you use a budget for your personal finances Make sure you are insured against risks (health insurance, life insurance, auto insurance, etc) Save more, spend less Set financial goals If you need help in this area, good resources include counseling through [employer] or other community resources, have a meeting with a Social research officer, government, and a good resource is the [employer] was seen today for annual exam.  Diagnoses and all orders for this visit:  Encounter for health maintenance examination in adult -     CBC with Differential/Platelet -     CMP14+EGFR -     Lipid panel -     Hemoglobin A1c -      PSA -     POCT Urinalysis DIP (Proadvantage Device)  Essential hypertension -     CMP14+EGFR  Screening for prostate cancer -     PSA  Hyperlipidemia, unspecified hyperlipidemia type -     Lipid panel  Impaired fasting blood sugar -     Hemoglobin A1c  Aortic atherosclerosis (HCC)  Screen for colon cancer -     Ambulatory referral to Gastroenterology  Wears dentures  Erectile dysfunction, unspecified erectile dysfunction type  Moderate persistent asthma, unspecified whether complicated    Follow-up pending labs, yearly for physical

## 2023-03-05 ENCOUNTER — Other Ambulatory Visit: Payer: Self-pay | Admitting: Medical

## 2023-03-05 DIAGNOSIS — I10 Essential (primary) hypertension: Secondary | ICD-10-CM

## 2023-03-05 LAB — CBC WITH DIFFERENTIAL/PLATELET
Basophils Absolute: 0 10*3/uL (ref 0.0–0.2)
Basos: 0 %
EOS (ABSOLUTE): 0.2 10*3/uL (ref 0.0–0.4)
Eos: 4 %
Hematocrit: 39.9 % (ref 37.5–51.0)
Hemoglobin: 12.8 g/dL — ABNORMAL LOW (ref 13.0–17.7)
Immature Grans (Abs): 0 10*3/uL (ref 0.0–0.1)
Immature Granulocytes: 0 %
Lymphocytes Absolute: 1.8 10*3/uL (ref 0.7–3.1)
Lymphs: 31 %
MCH: 28.4 pg (ref 26.6–33.0)
MCHC: 32.1 g/dL (ref 31.5–35.7)
MCV: 89 fL (ref 79–97)
Monocytes Absolute: 0.3 10*3/uL (ref 0.1–0.9)
Monocytes: 6 %
Neutrophils Absolute: 3.4 10*3/uL (ref 1.4–7.0)
Neutrophils: 59 %
Platelets: 202 10*3/uL (ref 150–450)
RBC: 4.5 x10E6/uL (ref 4.14–5.80)
RDW: 13.8 % (ref 11.6–15.4)
WBC: 5.8 10*3/uL (ref 3.4–10.8)

## 2023-03-05 LAB — LIPID PANEL
Chol/HDL Ratio: 2.7 ratio (ref 0.0–5.0)
Cholesterol, Total: 175 mg/dL (ref 100–199)
Triglycerides: 100 mg/dL (ref 0–149)

## 2023-03-05 LAB — CMP14+EGFR
ALT: 49 IU/L — ABNORMAL HIGH (ref 0–44)
AST: 50 IU/L — ABNORMAL HIGH (ref 0–40)
BUN/Creatinine Ratio: 18 (ref 9–20)
BUN: 22 mg/dL (ref 6–24)
CO2: 21 mmol/L (ref 20–29)
Calcium: 9.9 mg/dL (ref 8.7–10.2)
Chloride: 104 mmol/L (ref 96–106)
Creatinine, Ser: 1.19 mg/dL (ref 0.76–1.27)
Globulin, Total: 2.4 g/dL (ref 1.5–4.5)
Glucose: 77 mg/dL (ref 70–99)
Potassium: 4.4 mmol/L (ref 3.5–5.2)
Sodium: 140 mmol/L (ref 134–144)
eGFR: 71 mL/min/{1.73_m2} (ref >59)

## 2023-03-05 LAB — HEMOGLOBIN A1C
Est. average glucose Bld gHb Est-mCnc: 128 mg/dL
Hgb A1c MFr Bld: 6.1 % — ABNORMAL HIGH (ref 4.8–5.6)

## 2023-03-05 LAB — PSA: Prostate Specific Ag, Serum: 0.4 ng/mL (ref 0.0–4.0)

## 2023-03-05 MED ORDER — ALBUTEROL SULFATE HFA 108 (90 BASE) MCG/ACT IN AERS: 2.0000 | INHALATION_SPRAY | Freq: Four times a day (QID) | RESPIRATORY_TRACT | 2 refills | Status: DC | PRN

## 2023-03-05 MED ORDER — BUDESONIDE-FORMOTEROL FUMARATE 80-4.5 MCG/ACT IN AERO: 2.0000 | INHALATION_SPRAY | Freq: Two times a day (BID) | RESPIRATORY_TRACT | 11 refills | Status: AC

## 2023-03-05 MED ORDER — LOSARTAN POTASSIUM-HCTZ 50-12.5 MG PO TABS
1.0000 | ORAL_TABLET | Freq: Every day | ORAL | 3 refills | Status: AC
Start: 2023-03-05 — End: ?

## 2023-03-05 MED ORDER — ASPIRIN 81 MG PO TBEC: 81.0000 mg | DELAYED_RELEASE_TABLET | ORAL | 3 refills | Status: AC

## 2023-03-05 MED ORDER — SILDENAFIL CITRATE 100 MG PO TABS: ORAL_TABLET | ORAL | 5 refills | Status: AC

## 2023-03-05 MED ORDER — ROSUVASTATIN CALCIUM 10 MG PO TABS: 10.0000 mg | ORAL_TABLET | Freq: Every day | ORAL | 3 refills | Status: AC

## 2023-03-05 NOTE — Progress Notes (Signed)
Results sent through MyChart

## 2023-06-26 ENCOUNTER — Encounter (HOSPITAL_BASED_OUTPATIENT_CLINIC_OR_DEPARTMENT_OTHER): Payer: Self-pay

## 2023-06-26 ENCOUNTER — Emergency Department (HOSPITAL_BASED_OUTPATIENT_CLINIC_OR_DEPARTMENT_OTHER): Payer: BC Managed Care – PPO

## 2023-06-26 ENCOUNTER — Emergency Department (HOSPITAL_BASED_OUTPATIENT_CLINIC_OR_DEPARTMENT_OTHER)
Admission: EM | Admit: 2023-06-26 | Discharge: 2023-06-26 | Disposition: A | Discharge status: Home / Self Care | Payer: BC Managed Care – PPO | Attending: Emergency Medicine | Admitting: Emergency Medicine

## 2023-06-26 ENCOUNTER — Other Ambulatory Visit: Payer: Self-pay

## 2023-06-26 DIAGNOSIS — Z79899 Other long term (current) drug therapy: Secondary | ICD-10-CM | POA: Diagnosis not present

## 2023-06-26 DIAGNOSIS — I10 Essential (primary) hypertension: Secondary | ICD-10-CM | POA: Insufficient documentation

## 2023-06-26 DIAGNOSIS — S0990XA Unspecified injury of head, initial encounter: Secondary | ICD-10-CM | POA: Insufficient documentation

## 2023-06-26 DIAGNOSIS — Z7982 Long term (current) use of aspirin: Secondary | ICD-10-CM | POA: Insufficient documentation

## 2023-06-26 DIAGNOSIS — Z720 Tobacco use: Secondary | ICD-10-CM | POA: Insufficient documentation

## 2023-06-26 DIAGNOSIS — M25512 Pain in left shoulder: Secondary | ICD-10-CM | POA: Diagnosis not present

## 2023-06-26 DIAGNOSIS — R062 Wheezing: Secondary | ICD-10-CM | POA: Insufficient documentation

## 2023-06-26 DIAGNOSIS — Y92410 Unspecified street and highway as the place of occurrence of the external cause: Secondary | ICD-10-CM | POA: Diagnosis not present

## 2023-06-26 DIAGNOSIS — R519 Headache, unspecified: Secondary | ICD-10-CM | POA: Diagnosis not present

## 2023-06-26 DIAGNOSIS — M542 Cervicalgia: Secondary | ICD-10-CM | POA: Insufficient documentation

## 2023-06-26 MED ORDER — IBUPROFEN 600 MG PO TABS: 600.0000 mg | ORAL_TABLET | Freq: Four times a day (QID) | ORAL | 0 refills | Status: AC | PRN

## 2023-06-26 MED ORDER — METHOCARBAMOL 500 MG PO TABS: 500.0000 mg | ORAL_TABLET | Freq: Two times a day (BID) | ORAL | 0 refills | Status: DC | PRN

## 2023-06-26 MED ORDER — ACETAMINOPHEN 500 MG PO TABS
1000.0000 mg | ORAL_TABLET | Freq: Once | ORAL | Status: AC
  Administered 2023-06-26: 1000 mg via ORAL
  Filled 2023-06-26: qty 2
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
## 2023-06-26 NOTE — ED Notes (Signed)
D/c paperwork reviewed with pt, including prescriptions and follow up care.  All questions and/or concerns addressed at time of d/c.  No further needs expressed. . Pt verbalized understanding, Ambulatory without assistance to ED exit, NAD.   
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
## 2023-10-18 ENCOUNTER — Other Ambulatory Visit: Payer: Self-pay | Admitting: Medical

## 2023-11-27 ENCOUNTER — Ambulatory Visit: Payer: Self-pay

## 2023-11-27 NOTE — Telephone Encounter (Signed)
 Pt needs appt for cough.

## 2023-11-27 NOTE — Telephone Encounter (Signed)
 Copied from CRM [id number]. Topic: Clinical - Red Word Triage >> Nov 27, 2023 12:00 PM Shardie S wrote: [facility] that prompted transfer to Nurse Triage: Head cough, increased cough  Chief Complaint: cough, runny nose Symptoms: see above Frequency: started last week Pertinent Negatives: Patient denies cp, sputum, heart and lung history Disposition: [] ED /[] Urgent Care (no appt availability in office) / [] Appointment(In office/virtual)/ []  [facility]/ [] Home Care/ [] Refused Recommended Disposition /[] [facility]/ [x]  Follow-up with PCP Additional Notes: declined apt wants medication called in for his cough.  Care advice given, denies questions; instructed to go to ER if becomes worse.   Reason for Disposition  [1] MILD difficulty breathing (e.g., minimal/no SOB at rest, SOB with walking, pulse <100) AND [2] still present when not coughing  Answer Assessment - Initial Assessment Questions 1. ONSET: "When did the cough begin?"      Last week 2. SEVERITY: "How bad is the cough today?"      severe 3. SPUTUM: "Describe the color of your sputum" (none, dry cough; clear, white, yellow, green)     Won't come up 4. HEMOPTYSIS: "Are you coughing up any blood?" If so ask: "How much?" (flecks, streaks, tablespoons, etc.)     na 5. DIFFICULTY BREATHING: "Are you having difficulty breathing?" If Yes, ask: "How bad is it?" (e.g., mild, moderate, severe)    - MILD: No SOB at rest, mild SOB with walking, speaks normally in sentences, can lie down, no retractions, pulse < 100.    - MODERATE: SOB at rest, SOB with minimal exertion and prefers to sit, cannot lie down flat, speaks in phrases, mild retractions, audible wheezing, pulse 100-120.    - SEVERE: Very SOB at rest, speaks in single words, struggling to breathe, sitting hunched forward, retractions, pulse > 120      Has been using inhaler; worse at night 6. FEVER: "Do you have a fever?" If Yes, ask: "What is your temperature, how  was it measured, and when did it start?"     denies 7. CARDIAC HISTORY: "Do you have any history of heart disease?" (e.g., heart attack, congestive heart failure)      denies 8. LUNG HISTORY: "Do you have any history of lung disease?"  (e.g., pulmonary embolus, asthma, emphysema)     denies 9. PE RISK FACTORS: "Do you have a history of blood clots?" (or: recent major surgery, recent prolonged travel, bedridden)     denies 10. OTHER SYMPTOMS: "Do you have any other symptoms?" (e.g., runny nose, wheezing, chest pain)       Runny nose 11. PREGNANCY: "Is there any chance you are pregnant?" "When was your last menstrual period?"       na 12. TRAVEL: "Have you traveled out of the country in the last month?" (e.g., travel history, exposures)       na  Protocols used: Cough - Acute Non-Productive-A-AH

## 2023-12-01 ENCOUNTER — Ambulatory Visit: Admitting: Medical

## 2024-08-01 ENCOUNTER — Other Ambulatory Visit: Payer: Self-pay

## 2024-08-01 ENCOUNTER — Encounter (HOSPITAL_BASED_OUTPATIENT_CLINIC_OR_DEPARTMENT_OTHER): Payer: Self-pay

## 2024-08-01 ENCOUNTER — Emergency Department (HOSPITAL_BASED_OUTPATIENT_CLINIC_OR_DEPARTMENT_OTHER)
Admission: EM | Admit: 2024-08-01 | Discharge: 2024-08-01 | Disposition: A | Discharge status: Home / Self Care | Attending: Emergency Medicine | Admitting: Emergency Medicine

## 2024-08-01 DIAGNOSIS — Y92410 Unspecified street and highway as the place of occurrence of the external cause: Secondary | ICD-10-CM | POA: Diagnosis not present

## 2024-08-01 DIAGNOSIS — S161XXA Strain of muscle, fascia and tendon at neck level, initial encounter: Secondary | ICD-10-CM | POA: Diagnosis not present

## 2024-08-01 DIAGNOSIS — S199XXA Unspecified injury of neck, initial encounter: Secondary | ICD-10-CM | POA: Diagnosis present

## 2024-08-01 DIAGNOSIS — Z7982 Long term (current) use of aspirin: Secondary | ICD-10-CM | POA: Insufficient documentation

## 2024-08-01 DIAGNOSIS — M62838 Other muscle spasm: Secondary | ICD-10-CM | POA: Insufficient documentation

## 2024-08-01 DIAGNOSIS — S39012A Strain of muscle, fascia and tendon of lower back, initial encounter: Secondary | ICD-10-CM | POA: Insufficient documentation

## 2024-08-01 DIAGNOSIS — M7918 Myalgia, other site: Secondary | ICD-10-CM

## 2024-08-01 MED ORDER — DICLOFENAC SODIUM 75 MG PO TBEC: 75.0000 mg | DELAYED_RELEASE_TABLET | Freq: Two times a day (BID) | ORAL | 0 refills | Status: AC | PRN

## 2024-08-01 MED ORDER — METHOCARBAMOL 500 MG PO TABS: 500.0000 mg | ORAL_TABLET | Freq: Two times a day (BID) | ORAL | 0 refills | Status: AC | PRN

## 2024-08-01 MED ORDER — KETOROLAC TROMETHAMINE 30 MG/ML IJ SOLN
30.0000 mg | Freq: Once | INTRAMUSCULAR | Status: AC
  Administered 2024-08-01: 30 mg via INTRAMUSCULAR
  Filled 2024-08-01: qty 1

## 2024-08-01 MED ORDER — METHOCARBAMOL 500 MG PO TABS
500.0000 mg | ORAL_TABLET | Freq: Once | ORAL | Status: AC
  Administered 2024-08-01: 500 mg via ORAL
  Filled 2024-08-01: qty 1

## 2024-08-01 MED ORDER — LIDOCAINE 5 % EX PTCH
1.0000 | MEDICATED_PATCH | CUTANEOUS | Status: DC
  Administered 2024-08-01: 1 via TRANSDERMAL
  Filled 2024-08-01: qty 1
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
## 2024-08-01 NOTE — Discharge Instructions (Addendum)
 Today you were diagnosed with muscle strain of the cervical region (neck) and lumbar region (low back).  Muscle relaxers and medication for pain control sent to your pharmacy.  Please follow-up with your primary care physician for reevaluation if your pain does not improve in the next 7 to 10 days.
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
